# Patient Record
Sex: Female | Born: 1940 | Race: White | Hispanic: No | Smoking: Never smoker
Health system: Southern US, Community
[De-identification: ages and names within clinical notes are randomized; demographics above are authoritative.]

## PROBLEM LIST (undated history)

## (undated) DIAGNOSIS — F419 Anxiety disorder, unspecified: Secondary | ICD-10-CM

## (undated) DIAGNOSIS — M810 Age-related osteoporosis without current pathological fracture: Secondary | ICD-10-CM

## (undated) DIAGNOSIS — E079 Disorder of thyroid, unspecified: Secondary | ICD-10-CM

## (undated) DIAGNOSIS — I10 Essential (primary) hypertension: Secondary | ICD-10-CM

## (undated) DIAGNOSIS — F32A Depression, unspecified: Secondary | ICD-10-CM

## (undated) DIAGNOSIS — M1990 Unspecified osteoarthritis, unspecified site: Secondary | ICD-10-CM

## (undated) DIAGNOSIS — T7840XA Allergy, unspecified, initial encounter: Secondary | ICD-10-CM

## (undated) DIAGNOSIS — D649 Anemia, unspecified: Secondary | ICD-10-CM

## (undated) DIAGNOSIS — F329 Major depressive disorder, single episode, unspecified: Secondary | ICD-10-CM

## (undated) DIAGNOSIS — G709 Myoneural disorder, unspecified: Secondary | ICD-10-CM

## (undated) DIAGNOSIS — J45909 Unspecified asthma, uncomplicated: Secondary | ICD-10-CM

## (undated) DIAGNOSIS — H269 Unspecified cataract: Secondary | ICD-10-CM

## (undated) DIAGNOSIS — K219 Gastro-esophageal reflux disease without esophagitis: Secondary | ICD-10-CM

## (undated) DIAGNOSIS — E119 Type 2 diabetes mellitus without complications: Secondary | ICD-10-CM

## (undated) DIAGNOSIS — E785 Hyperlipidemia, unspecified: Secondary | ICD-10-CM

## (undated) HISTORY — PX: POLYPECTOMY: SHX149

## (undated) HISTORY — DX: Unspecified asthma, uncomplicated: J45.909

## (undated) HISTORY — DX: Disorder of thyroid, unspecified: E07.9

## (undated) HISTORY — DX: Hyperlipidemia, unspecified: E78.5

## (undated) HISTORY — DX: Anxiety disorder, unspecified: F41.9

## (undated) HISTORY — DX: Age-related osteoporosis without current pathological fracture: M81.0

## (undated) HISTORY — PX: COLONOSCOPY: SHX174

## (undated) HISTORY — DX: Depression, unspecified: F32.A

## (undated) HISTORY — PX: ABDOMINAL HYSTERECTOMY: SHX81

## (undated) HISTORY — DX: Unspecified cataract: H26.9

## (undated) HISTORY — DX: Myoneural disorder, unspecified: G70.9

## (undated) HISTORY — DX: Major depressive disorder, single episode, unspecified: F32.9

## (undated) HISTORY — PX: UPPER GASTROINTESTINAL ENDOSCOPY: SHX188

## (undated) HISTORY — DX: Allergy, unspecified, initial encounter: T78.40XA

## (undated) HISTORY — PX: CHOLECYSTECTOMY: SHX55

## (undated) HISTORY — DX: Unspecified osteoarthritis, unspecified site: M19.90

## (undated) HISTORY — DX: Gastro-esophageal reflux disease without esophagitis: K21.9

## (undated) HISTORY — DX: Essential (primary) hypertension: I10

## (undated) HISTORY — DX: Anemia, unspecified: D64.9

## (undated) HISTORY — DX: Type 2 diabetes mellitus without complications: E11.9

---

## 2006-12-12 ENCOUNTER — Ambulatory Visit (HOSPITAL_COMMUNITY): Admission: RE | Admit: 2006-12-12 | Discharge: 2006-12-12 | Payer: Self-pay | Admitting: Family Medicine

## 2007-12-18 ENCOUNTER — Ambulatory Visit (HOSPITAL_COMMUNITY): Admission: RE | Admit: 2007-12-18 | Discharge: 2007-12-18 | Payer: Self-pay | Admitting: Family Medicine

## 2009-03-15 ENCOUNTER — Ambulatory Visit (HOSPITAL_BASED_OUTPATIENT_CLINIC_OR_DEPARTMENT_OTHER): Admission: RE | Admit: 2009-03-15 | Discharge: 2009-03-15 | Payer: Self-pay | Admitting: Family Medicine

## 2009-03-15 ENCOUNTER — Ambulatory Visit: Payer: Self-pay | Admitting: Diagnostic Radiology

## 2010-05-22 ENCOUNTER — Ambulatory Visit (HOSPITAL_COMMUNITY): Admission: RE | Admit: 2010-05-22 | Discharge: 2010-05-22 | Payer: Self-pay | Admitting: Family Medicine

## 2010-10-21 ENCOUNTER — Encounter: Payer: Self-pay | Admitting: Family Medicine

## 2012-04-13 ENCOUNTER — Other Ambulatory Visit: Payer: Self-pay | Admitting: Family Medicine

## 2012-05-08 DIAGNOSIS — H2510 Age-related nuclear cataract, unspecified eye: Secondary | ICD-10-CM | POA: Diagnosis not present

## 2012-05-08 DIAGNOSIS — H04129 Dry eye syndrome of unspecified lacrimal gland: Secondary | ICD-10-CM | POA: Diagnosis not present

## 2012-05-08 DIAGNOSIS — H524 Presbyopia: Secondary | ICD-10-CM | POA: Diagnosis not present

## 2012-05-08 DIAGNOSIS — E119 Type 2 diabetes mellitus without complications: Secondary | ICD-10-CM | POA: Diagnosis not present

## 2012-05-15 ENCOUNTER — Ambulatory Visit (INDEPENDENT_AMBULATORY_CARE_PROVIDER_SITE_OTHER): Payer: Medicare Other | Admitting: Family Medicine

## 2012-05-15 VITALS — BP 116/73 | HR 75 | Temp 98.8°F | Resp 20 | Ht 64.5 in | Wt 261.0 lb

## 2012-05-15 DIAGNOSIS — K219 Gastro-esophageal reflux disease without esophagitis: Secondary | ICD-10-CM | POA: Insufficient documentation

## 2012-05-15 DIAGNOSIS — J309 Allergic rhinitis, unspecified: Secondary | ICD-10-CM | POA: Diagnosis not present

## 2012-05-15 DIAGNOSIS — R609 Edema, unspecified: Secondary | ICD-10-CM | POA: Insufficient documentation

## 2012-05-15 DIAGNOSIS — E7800 Pure hypercholesterolemia, unspecified: Secondary | ICD-10-CM

## 2012-05-15 DIAGNOSIS — M899 Disorder of bone, unspecified: Secondary | ICD-10-CM

## 2012-05-15 DIAGNOSIS — I10 Essential (primary) hypertension: Secondary | ICD-10-CM | POA: Insufficient documentation

## 2012-05-15 DIAGNOSIS — E119 Type 2 diabetes mellitus without complications: Secondary | ICD-10-CM | POA: Diagnosis not present

## 2012-05-15 DIAGNOSIS — E039 Hypothyroidism, unspecified: Secondary | ICD-10-CM

## 2012-05-15 DIAGNOSIS — F329 Major depressive disorder, single episode, unspecified: Secondary | ICD-10-CM | POA: Insufficient documentation

## 2012-05-15 DIAGNOSIS — E559 Vitamin D deficiency, unspecified: Secondary | ICD-10-CM | POA: Insufficient documentation

## 2012-05-15 DIAGNOSIS — M8580 Other specified disorders of bone density and structure, unspecified site: Secondary | ICD-10-CM | POA: Insufficient documentation

## 2012-05-15 LAB — POCT CBC
HCT, POC: 50.3 % — AB (ref 37.7–47.9)
Hemoglobin: 15.4 g/dL (ref 12.2–16.2)
Lymph, poc: 2.1 (ref 0.6–3.4)
MCHC: 30.6 g/dL — AB (ref 31.8–35.4)
MCV: 94.6 fL (ref 80–97)
POC LYMPH PERCENT: 31.8 %L (ref 10–50)
RDW, POC: 14.3 %

## 2012-05-15 MED ORDER — CITALOPRAM HYDROBROMIDE 40 MG PO TABS: 20.0000 mg | ORAL_TABLET | Freq: Every day | ORAL | Status: DC

## 2012-05-15 MED ORDER — FUROSEMIDE 40 MG PO TABS: 40.0000 mg | ORAL_TABLET | Freq: Two times a day (BID) | ORAL | Status: DC

## 2012-05-15 MED ORDER — ATENOLOL 25 MG PO TABS: 25.0000 mg | ORAL_TABLET | Freq: Every day | ORAL | Status: DC

## 2012-05-15 MED ORDER — LOVASTATIN 40 MG PO TABS: 40.0000 mg | ORAL_TABLET | Freq: Every day | ORAL | Status: DC

## 2012-05-15 MED ORDER — TRAZODONE HCL 100 MG PO TABS: 50.0000 mg | ORAL_TABLET | Freq: Every day | ORAL | Status: DC

## 2012-05-15 MED ORDER — POTASSIUM CHLORIDE ER 10 MEQ PO TBCR: 10.0000 meq | EXTENDED_RELEASE_TABLET | Freq: Every day | ORAL | Status: DC

## 2012-05-15 MED ORDER — METFORMIN HCL 500 MG PO TABS: 500.0000 mg | ORAL_TABLET | Freq: Every day | ORAL | Status: DC

## 2012-05-15 MED ORDER — LEVOTHYROXINE SODIUM 125 MCG PO TABS: 125.0000 ug | ORAL_TABLET | Freq: Every day | ORAL | Status: DC

## 2012-05-15 MED ORDER — QUINAPRIL HCL 20 MG PO TABS: 20.0000 mg | ORAL_TABLET | Freq: Every day | ORAL | Status: DC

## 2012-05-15 MED ORDER — MOMETASONE FUROATE 50 MCG/ACT NA SUSP: 2.0000 | Freq: Every day | NASAL | Status: DC

## 2012-05-15 NOTE — Progress Notes (Signed)
[facility] [address], [place] [address] [phone]  Date:  05/15/2012   Name:  Sandra Hughes   DOB:  March 15, 1941   MRN:  [id number]  PCP:  No primary provider on file.    Chief Complaint: Follow-up and Allergic Rhinitis    History of Present Illness:  Sandra Hughes is a 71 y.o. very pleasant female patient who presents with the following:  She is here today for follow-up and for labs.  She needs refills of her medication and labs. I have not seen her in about 2 years, and she wants to start seeing [place] regularly again.  However, it seems that she has medicaid in addition to medicare- this is probably why she has not been able to list our office as her PCP.  Explained to her that [facility] does not accept medicaid- however we do accept medicare and are happy to continue seeing her.    Maira states that all of her medical problems are unchanged, and that she is doing well and enjoying life currently.  She has no particular complaints.   She has not eaten for about 5 hours- not fasting for 8 hours however.  Patient Active Problem List  Diagnosis  . HTN (hypertension)  . Edema  . Allergic rhinitis  . Diabetes mellitus type II  . Osteopenia  . Depression  . Hypothyroidism  . GERD (gastroesophageal reflux disease)  . Hypercholesterolemia  . Vitamin d deficiency    No past medical history on file.  No past surgical history on file.  History  Substance Use Topics  . Smoking status: Never Smoker   . Smokeless tobacco: Not on file  . Alcohol Use: Not on file    No family history on file.  Allergies  Allergen Reactions  . Aspirin Anaphylaxis    Medication list has been reviewed and updated.  Current Outpatient Prescriptions on File Prior to Visit  Medication Sig Dispense Refill  . atenolol (TENORMIN) 25 MG tablet Take 25 mg by mouth daily.      . citalopram (CELEXA) 40 MG tablet Take 40 mg by mouth daily.      . furosemide (LASIX) 40 MG tablet  Take 1 tablet (40 mg total) by mouth 2 (two) times daily. Needs office visit  60 tablet  0  . levothyroxine (SYNTHROID, LEVOTHROID) 125 MCG tablet Take 1 tablet (125 mcg total) by mouth daily. Needs office visit  30 tablet  0  . lovastatin (MEVACOR) 40 MG tablet Take 1 tablet (40 mg total) by mouth daily. Needs office visit  30 tablet  0  . metFORMIN (GLUCOPHAGE) 500 MG tablet Take 1 tablet (500 mg total) by mouth daily. Needs office visit  30 tablet  0  . potassium chloride (KLOR-CON 10) 10 MEQ tablet Take 1 tablet (10 mEq total) by mouth daily. Needs office visit  30 tablet  0  . quinapril (ACCUPRIL) 20 MG tablet Take 1 tablet (20 mg total) by mouth daily. Needs office visit  30 tablet  0  . traZODone (DESYREL) 100 MG tablet Take 50 mg by mouth at bedtime.        Review of Systems:  As per HPI- otherwise negative.   Physical Examination: Filed Vitals:   05/15/12 1626  BP: 116/73  Pulse: 75  Temp: 98.8 F (37.1 C)  Resp: 20   Filed Vitals:   05/15/12 1626  Height: 5' 4.5" (1.638 m)  Weight: 261 lb (118.389 kg)  Body mass index is 44.11 kg/(m^2). Ideal Body Weight: Weight in (lb) to have BMI = 25: 147.6   GEN: WDWN, NAD, Non-toxic, A & O x 3, obese HEENT: Atraumatic, Normocephalic. Neck supple. No masses, No LAD.  PEERL, EOMI, TM and oropharynx wnl Ears and Nose: No external deformity. CV: RRR, No M/G/R. No JVD. No thrill. No extra heart sounds. PULM: CTA B, no wheezes, crackles, rhonchi. No retractions. No resp. distress. No accessory muscle use. ABD: S, NT, ND, +BS. No rebound. No HSM. EXTR: No c/c/e NEURO Normal gait.  PSYCH: Normally interactive. Conversant. Not depressed or anxious appearing.  Calm demeanor.   Results for orders placed in visit on 05/15/12  POCT CBC      Component Value Range   WBC 6.7  4.6 - 10.2 K/uL   Lymph, poc 2.1  0.6 - 3.4   POC LYMPH PERCENT 31.8  10 - 50 %L   MID (cbc) 0.6  0 - 0.9   POC MID % 9.1  0 - 12 %M   POC Granulocyte 4.0  2 -  6.9   Granulocyte percent 59.1  37 - 80 %G   RBC 5.32  4.04 - 5.48 M/uL   Hemoglobin 15.4  12.2 - 16.2 g/dL   HCT, POC 16.1 (*) 09.6 - 47.9 %   MCV 94.6  80 - 97 fL   MCH, POC 28.9  27 - 31.2 pg   MCHC 30.6 (*) 31.8 - 35.4 g/dL   RDW, POC 04.5     Platelet Count, POC 385  142 - 424 K/uL   MPV 9.2  0 - 99.8 fL  POCT GLYCOSYLATED HEMOGLOBIN (HGB A1C)      Component Value Range   Hemoglobin A1C 5.6     Assessment and Plan:  1. Diabetes mellitus type II  POCT glycosylated hemoglobin (Hb A1C), metFORMIN (GLUCOPHAGE) 500 MG tablet  2. HTN (hypertension)  atenolol (TENORMIN) 25 MG tablet, quinapril (ACCUPRIL) 20 MG tablet, POCT CBC, Comprehensive metabolic panel  3. Edema  furosemide (LASIX) 40 MG tablet, potassium chloride (KLOR-CON 10) 10 MEQ tablet  4. Allergic rhinitis  mometasone (NASONEX) 50 MCG/ACT nasal spray  5. Osteopenia    6. Depression  citalopram (CELEXA) 40 MG tablet, traZODone (DESYREL) 100 MG tablet  7. Hypothyroidism  levothyroxine (SYNTHROID, LEVOTHROID) 125 MCG tablet, TSH  8. GERD (gastroesophageal reflux disease)    9. Hypercholesterolemia  lovastatin (MEVACOR) 40 MG tablet, Lipid panel  10. Vitamin d deficiency     Excellent DM control, BP looks great, her edema is controlled.  Refilled all of her medications as well as her test strips.  Will plan further follow- up pending labs.  Orders Today:  Orders Placed This Encounter  Procedures  . Comprehensive metabolic panel  . Lipid panel  . TSH  . POCT CBC  . POCT glycosylated hemoglobin (Hb A1C)    Medications Today: (Includes new updates added during medication reconciliation) Meds ordered this encounter  Medications  . DISCONTD: traZODone (DESYREL) 100 MG tablet    Sig: Take 50 mg by mouth at bedtime.  Marland Kitchen DISCONTD: citalopram (CELEXA) 40 MG tablet    Sig: Take 40 mg by mouth daily.  Marland Kitchen DISCONTD: atenolol (TENORMIN) 25 MG tablet    Sig: Take 25 mg by mouth daily.  . cycloSPORINE (RESTASIS) 0.05 % ophthalmic  emulsion    Sig: Place 1 drop into both eyes 2 (two) times daily.  Marland Kitchen atenolol (TENORMIN) 25 MG tablet    Sig: Take  1 tablet (25 mg total) by mouth daily.    Dispense:  90 tablet    Refill:  3  . citalopram (CELEXA) 40 MG tablet    Sig: Take 0.5 tablets (20 mg total) by mouth daily.    Dispense:  45 tablet    Refill:  3  . furosemide (LASIX) 40 MG tablet    Sig: Take 1 tablet (40 mg total) by mouth 2 (two) times daily. Needs office visit    Dispense:  180 tablet    Refill:  3  . levothyroxine (SYNTHROID, LEVOTHROID) 125 MCG tablet    Sig: Take 1 tablet (125 mcg total) by mouth daily. Needs office visit    Dispense:  90 tablet    Refill:  3  . lovastatin (MEVACOR) 40 MG tablet    Sig: Take 1 tablet (40 mg total) by mouth daily. Needs office visit    Dispense:  30 tablet    Refill:  0  . potassium chloride (KLOR-CON 10) 10 MEQ tablet    Sig: Take 1 tablet (10 mEq total) by mouth daily. Needs office visit    Dispense:  90 tablet    Refill:  3  . quinapril (ACCUPRIL) 20 MG tablet    Sig: Take 1 tablet (20 mg total) by mouth daily. Needs office visit    Dispense:  90 tablet    Refill:  3  . traZODone (DESYREL) 100 MG tablet    Sig: Take 0.5 tablets (50 mg total) by mouth at bedtime.    Dispense:  45 tablet    Refill:  3  . mometasone (NASONEX) 50 MCG/ACT nasal spray    Sig: Place 2 sprays into the nose daily.    Dispense:  17 g    Refill:  12  . metFORMIN (GLUCOPHAGE) 500 MG tablet    Sig: Take 1 tablet (500 mg total) by mouth daily. Needs office visit    Dispense:  90 tablet    Refill:  3    Medications Discontinued: Medications Discontinued During This Encounter  Medication Reason  . atenolol (TENORMIN) 25 MG tablet Reorder  . citalopram (CELEXA) 40 MG tablet Reorder  . furosemide (LASIX) 40 MG tablet Reorder  . levothyroxine (SYNTHROID, LEVOTHROID) 125 MCG tablet Reorder  . lovastatin (MEVACOR) 40 MG tablet Reorder  . potassium chloride (KLOR-CON 10) 10 MEQ tablet  Reorder  . quinapril (ACCUPRIL) 20 MG tablet Reorder  . traZODone (DESYREL) 100 MG tablet Reorder  . metFORMIN (GLUCOPHAGE) 500 MG tablet Reorder     Abbe Amsterdam, MD

## 2012-05-16 ENCOUNTER — Other Ambulatory Visit: Payer: Self-pay | Admitting: Family Medicine

## 2012-05-16 LAB — COMPREHENSIVE METABOLIC PANEL
Albumin: 4.3 g/dL (ref 3.5–5.2)
Alkaline Phosphatase: 80 U/L (ref 39–117)
BUN: 17 mg/dL (ref 6–23)
CO2: 25 mEq/L (ref 19–32)
Glucose, Bld: 99 mg/dL (ref 70–99)
Potassium: 4.4 mEq/L (ref 3.5–5.3)
Total Bilirubin: 0.3 mg/dL (ref 0.3–1.2)
Total Protein: 7.4 g/dL (ref 6.0–8.3)

## 2012-05-16 LAB — LIPID PANEL
Cholesterol: 206 mg/dL — ABNORMAL HIGH (ref 0–200)
HDL: 58 mg/dL (ref >39)
LDL Cholesterol: 109 mg/dL — ABNORMAL HIGH (ref 0–99)
Triglycerides: 195 mg/dL — ABNORMAL HIGH (ref <150)

## 2012-05-17 ENCOUNTER — Encounter: Payer: Self-pay | Admitting: Family Medicine

## 2012-05-18 ENCOUNTER — Telehealth: Payer: Self-pay | Admitting: Radiology

## 2012-05-18 DIAGNOSIS — E7800 Pure hypercholesterolemia, unspecified: Secondary | ICD-10-CM

## 2012-05-18 MED ORDER — LOVASTATIN 40 MG PO TABS: 40.0000 mg | ORAL_TABLET | Freq: Every day | ORAL | Status: DC

## 2012-05-22 DIAGNOSIS — E119 Type 2 diabetes mellitus without complications: Secondary | ICD-10-CM | POA: Diagnosis not present

## 2012-05-22 NOTE — Telephone Encounter (Signed)
Medication encounter

## 2012-08-23 ENCOUNTER — Ambulatory Visit (INDEPENDENT_AMBULATORY_CARE_PROVIDER_SITE_OTHER): Payer: Medicare Other | Admitting: Family Medicine

## 2012-08-23 VITALS — BP 148/86 | HR 90 | Temp 98.0°F | Resp 18 | Ht 64.5 in | Wt 259.0 lb

## 2012-08-23 DIAGNOSIS — Z23 Encounter for immunization: Secondary | ICD-10-CM | POA: Diagnosis not present

## 2012-08-23 DIAGNOSIS — E039 Hypothyroidism, unspecified: Secondary | ICD-10-CM

## 2012-08-23 DIAGNOSIS — I10 Essential (primary) hypertension: Secondary | ICD-10-CM

## 2012-08-23 LAB — TSH: TSH: 1.298 u[IU]/mL (ref 0.350–4.500)

## 2012-08-23 LAB — POCT CBC
Hemoglobin: 15 g/dL (ref 12.2–16.2)
Lymph, poc: 2.2 (ref 0.6–3.4)
MCH, POC: 29.4 pg (ref 27–31.2)
MCHC: 31.3 g/dL — AB (ref 31.8–35.4)
MID (cbc): 0.5 (ref 0–0.9)
MPV: 8.9 fL (ref 0–99.8)
POC Granulocyte: 3.8 (ref 2–6.9)
POC MID %: 7.3 %M (ref 0–12)
Platelet Count, POC: 383 10*3/uL (ref 142–424)
RDW, POC: 14.7 %
WBC: 6.5 10*3/uL (ref 4.6–10.2)

## 2012-08-23 LAB — BASIC METABOLIC PANEL
Calcium: 8.9 mg/dL (ref 8.4–10.5)
Creat: 0.86 mg/dL (ref 0.50–1.10)
Sodium: 139 mEq/L (ref 135–145)

## 2012-08-23 NOTE — Progress Notes (Signed)
[facility] [address] [place] [address] [phone]  Date:  08/23/2012   Name:  Sandra Hughes   DOB:  Jul 07, 1941   MRN:  [id number]  PCP:  Tally Due, MD    Chief Complaint: Hypertension and Labs Only   History of Present Illness:  Sandra Hughes is a 71 y.o. very pleasant female patient who presents with the following:  She is here today with concern regarding her BP.  Over the last month or so her BP has been running higher.  In August her BP was 116/73.  Her BP has been running about 145- 155/ 108-119.  However, her personal cuff was reading higher than our cuff here today so she is afraid it may not be totally accurate.  She usually checks her BP at night.  She feels that she gets "cold inside" when her BP is too high or to low, so this is her cue to check her BP.    She also has well controlled DM, edema, AR and hypothyroidism.   She has not noted any CP Patient Active Problem List  Diagnosis  . HTN (hypertension)  . Edema  . Allergic rhinitis  . Diabetes mellitus type II  . Osteopenia  . Depression  . Hypothyroidism  . GERD (gastroesophageal reflux disease)  . Hypercholesterolemia  . Vitamin d deficiency    Past Medical History  Diagnosis Date  . Allergy   . Anemia   . Anxiety   . Arthritis   . Depression   . GERD (gastroesophageal reflux disease)   . Diabetes mellitus without complication   . Osteoporosis   . Hypertension   . Thyroid disease     Past Surgical History  Procedure Date  . Cholecystectomy   . Abdominal hysterectomy     no cancer    History  Substance Use Topics  . Smoking status: Never Smoker   . Smokeless tobacco: Never Used  . Alcohol Use: No    Family History  Problem Relation Age of Onset  . Hypertension Mother   . COPD Father   . Arthritis Sister   . Diabetes Brother   . Cancer Maternal Grandfather     skin cancer  . Heart disease Paternal Grandfather   . Diabetes Sister      Allergies  Allergen Reactions  . Aspirin Anaphylaxis    Medication list has been reviewed and updated.  Current Outpatient Prescriptions on File Prior to Visit  Medication Sig Dispense Refill  . atenolol (TENORMIN) 25 MG tablet TAKE 1 TABLET EVERY DAY  90 tablet  3  . citalopram (CELEXA) 40 MG tablet TAKE 1 TABLET EVERY DAY  90 tablet  2  . cycloSPORINE (RESTASIS) 0.05 % ophthalmic emulsion Place 1 drop into both eyes 2 (two) times daily.      . furosemide (LASIX) 40 MG tablet Take 1 tablet (40 mg total) by mouth 2 (two) times daily. Needs office visit  180 tablet  3  . levothyroxine (SYNTHROID, LEVOTHROID) 125 MCG tablet Take 1 tablet (125 mcg total) by mouth daily. Needs office visit  90 tablet  3  . lovastatin (MEVACOR) 40 MG tablet Take 1 tablet (40 mg total) by mouth daily. Needs office visit  90 tablet  2  . metFORMIN (GLUCOPHAGE) 500 MG tablet Take 1 tablet (500 mg total) by mouth daily. Needs office visit  90 tablet  3  . mometasone (NASONEX) 50 MCG/ACT nasal spray Place 2 sprays  into the nose daily.  17 g  12  . potassium chloride (KLOR-CON 10) 10 MEQ tablet Take 1 tablet (10 mEq total) by mouth daily. Needs office visit  90 tablet  3  . quinapril (ACCUPRIL) 20 MG tablet Take 1 tablet (20 mg total) by mouth daily. Needs office visit  90 tablet  3  . traZODone (DESYREL) 100 MG tablet Take 0.5 tablets (50 mg total) by mouth at bedtime.  45 tablet  3    Review of Systems:  As per HPI- otherwise negative.  Physical Examination: Filed Vitals:   08/23/12 1044  BP: 148/86  Pulse: 90  Temp: 98 F (36.7 C)  Resp: 18   Filed Vitals:   08/23/12 1044  Height: 5' 4.5" (1.638 m)  Weight: 259 lb (117.482 kg)   Body mass index is 43.77 kg/(m^2). Ideal Body Weight: Weight in (lb) to have BMI = 25: 147.6   GEN: WDWN, NAD, Non-toxic, A & O x 3, obese HEENT: Atraumatic, Normocephalic. Neck supple. No masses, No LAD. Bilateral TM wnl, oropharynx normal.  PEERL,EOMI.   Ears and  Nose: No external deformity. CV: RRR, No M/G/R. No JVD. No thrill. No extra heart sounds. PULM: CTA B, no wheezes, crackles, rhonchi. No retractions. No resp. distress. No accessory muscle use. ABD: S, NT, ND EXTR: No c/c/e NEURO Normal gait.  PSYCH: Normally interactive. Conversant. Not depressed or anxious appearing.  Calm demeanor.   Results for orders placed in visit on 08/23/12  POCT CBC      Component Value Range   WBC 6.5  4.6 - 10.2 K/uL   Lymph, poc 2.2  0.6 - 3.4   POC LYMPH PERCENT 33.6  10 - 50 %L   MID (cbc) 0.5  0 - 0.9   POC MID % 7.3  0 - 12 %M   POC Granulocyte 3.8  2 - 6.9   Granulocyte percent 59.1  37 - 80 %G   RBC 5.11  4.04 - 5.48 M/uL   Hemoglobin 15.0  12.2 - 16.2 g/dL   HCT, POC 45.4 (*) 09.8 - 47.9 %   MCV 94.0  80 - 97 fL   MCH, POC 29.4  27 - 31.2 pg   MCHC 31.3 (*) 31.8 - 35.4 g/dL   RDW, POC 11.9     Platelet Count, POC 383  142 - 424 K/uL   MPV 8.9  0 - 99.8 fL   Await other labs  EKG: sinus rhythm with RBBB- this is stable from last year. Carefully compared to EKG from 04/20/11 and there is no significant change  Assessment and Plan: 1. HTN (hypertension)  POCT CBC, Basic metabolic panel, TSH, EKG 12-Lead, Flu vaccine greater than or equal to 3yo preservative free IM  2. Hypothyroidism  TSH  mildly elevated BP.  Will increase her quinapril slowly- see pt instructions.   Check TSH.   Will plan further follow- up pending labs. Encouraged her to find a weight loss plan.  She really wants to do this but lacks transportation and feels somewhat unmotivated to make what she knows are necessary changes.    Abbe Amsterdam, MD

## 2012-08-23 NOTE — Patient Instructions (Addendum)
Please add a 1/2 accupril in the evening.  So you will take 20 mg in the am, 10 in the pm.  Please give me a call and let me know how your BP is looking in the next couple of weeks.    Think about joining weight watchers-you can do this online.

## 2012-08-24 ENCOUNTER — Telehealth: Payer: Self-pay | Admitting: *Deleted

## 2012-08-24 ENCOUNTER — Encounter: Payer: Self-pay | Admitting: Family Medicine

## 2012-08-31 ENCOUNTER — Telehealth: Payer: Self-pay

## 2012-08-31 NOTE — Telephone Encounter (Signed)
Please give her a call- that sounds great, let's see her back in about 3 months

## 2012-08-31 NOTE — Telephone Encounter (Signed)
Pt was told to call in regards to blood pressure - with increase in bp meds it is 130-75 with pulse of 61   Best number [phone]

## 2012-08-31 NOTE — Telephone Encounter (Signed)
Patient called and was told that BP looked great and to follow up in about 3 months. Patient stated she will keep doing what she is doing.

## 2012-10-12 ENCOUNTER — Telehealth: Payer: Self-pay

## 2012-10-12 DIAGNOSIS — I10 Essential (primary) hypertension: Secondary | ICD-10-CM

## 2012-10-12 MED ORDER — QUINAPRIL HCL 20 MG PO TABS: ORAL_TABLET | ORAL | Status: DC

## 2012-10-12 NOTE — Telephone Encounter (Signed)
Called her, she is taking  1 pill am and 1/2 pill at night. Her blood pressures have been about 140/78. She states this is good for her.  She states she uses [facility] and we need to make sure they know we increased the dose and this is the reason she needs to get it early. Please advise on dosing.

## 2012-10-12 NOTE — Telephone Encounter (Signed)
PT WOULD LIKE TO KNOW IF THE DR WANT TO UP HER DOSAGE ON HER QUINAPRIL OR KEEP IT THE SAME, SHE IS ABOUT OUT PLEASE CALL [phone] OR HER CELL AT [phone]

## 2012-10-12 NOTE — Telephone Encounter (Signed)
Called and LMOM- that sounds fine.  I will send in a refill for her and will increase the amount that she gets each month.  Will send in a 90 day supply. Her new dosage will be quinapril 20, 1 q am and 1/2 q pm

## 2013-02-07 ENCOUNTER — Other Ambulatory Visit: Payer: Self-pay | Admitting: Family Medicine

## 2013-05-08 ENCOUNTER — Other Ambulatory Visit: Payer: Self-pay | Admitting: Family Medicine

## 2013-05-11 ENCOUNTER — Other Ambulatory Visit: Payer: Self-pay | Admitting: Family Medicine

## 2013-05-12 ENCOUNTER — Other Ambulatory Visit: Payer: Self-pay | Admitting: Physician Assistant

## 2013-05-12 NOTE — Telephone Encounter (Signed)
Called patient she needs appt.

## 2013-05-12 NOTE — Telephone Encounter (Signed)
Patient advised she needs office visit.

## 2013-05-26 ENCOUNTER — Ambulatory Visit (INDEPENDENT_AMBULATORY_CARE_PROVIDER_SITE_OTHER): Payer: Medicare Other | Admitting: Family Medicine

## 2013-05-26 VITALS — BP 118/66 | HR 62 | Temp 99.0°F | Resp 18 | Ht 64.5 in | Wt 253.8 lb

## 2013-05-26 DIAGNOSIS — I10 Essential (primary) hypertension: Secondary | ICD-10-CM | POA: Diagnosis not present

## 2013-05-26 DIAGNOSIS — F329 Major depressive disorder, single episode, unspecified: Secondary | ICD-10-CM

## 2013-05-26 DIAGNOSIS — E7800 Pure hypercholesterolemia, unspecified: Secondary | ICD-10-CM

## 2013-05-26 DIAGNOSIS — E119 Type 2 diabetes mellitus without complications: Secondary | ICD-10-CM | POA: Diagnosis not present

## 2013-05-26 DIAGNOSIS — Z23 Encounter for immunization: Secondary | ICD-10-CM

## 2013-05-26 DIAGNOSIS — E669 Obesity, unspecified: Secondary | ICD-10-CM

## 2013-05-26 DIAGNOSIS — E039 Hypothyroidism, unspecified: Secondary | ICD-10-CM

## 2013-05-26 DIAGNOSIS — R609 Edema, unspecified: Secondary | ICD-10-CM

## 2013-05-26 DIAGNOSIS — H04129 Dry eye syndrome of unspecified lacrimal gland: Secondary | ICD-10-CM

## 2013-05-26 DIAGNOSIS — J309 Allergic rhinitis, unspecified: Secondary | ICD-10-CM

## 2013-05-26 LAB — POCT CBC
HCT, POC: 44.3 % (ref 37.7–47.9)
Hemoglobin: 14.3 g/dL (ref 12.2–16.2)
Lymph, poc: 2 (ref 0.6–3.4)
MCH, POC: 30.6 pg (ref 27–31.2)
MCHC: 32.3 g/dL (ref 31.8–35.4)
MCV: 94.7 fL (ref 80–97)
POC Granulocyte: 4.7 (ref 2–6.9)
POC LYMPH PERCENT: 27.3 %L (ref 10–50)
RDW, POC: 15.8 %
WBC: 7.2 10*3/uL (ref 4.6–10.2)

## 2013-05-26 LAB — LIPID PANEL
LDL Cholesterol: 111 mg/dL — ABNORMAL HIGH (ref 0–99)
Total CHOL/HDL Ratio: 3.1 Ratio
VLDL: 34 mg/dL (ref 0–40)

## 2013-05-26 LAB — COMPREHENSIVE METABOLIC PANEL
Albumin: 4.3 g/dL (ref 3.5–5.2)
BUN: 26 mg/dL — ABNORMAL HIGH (ref 6–23)
CO2: 27 mEq/L (ref 19–32)
Glucose, Bld: 138 mg/dL — ABNORMAL HIGH (ref 70–99)
Sodium: 140 mEq/L (ref 135–145)
Total Bilirubin: 0.4 mg/dL (ref 0.3–1.2)
Total Protein: 7.8 g/dL (ref 6.0–8.3)

## 2013-05-26 LAB — TSH: TSH: 0.959 u[IU]/mL (ref 0.350–4.500)

## 2013-05-26 MED ORDER — LEVOTHYROXINE SODIUM 125 MCG PO TABS: ORAL_TABLET | ORAL | Status: DC

## 2013-05-26 MED ORDER — POTASSIUM CHLORIDE ER 10 MEQ PO TBCR: 10.0000 meq | EXTENDED_RELEASE_TABLET | Freq: Every day | ORAL | Status: DC

## 2013-05-26 MED ORDER — QUINAPRIL HCL 20 MG PO TABS: ORAL_TABLET | ORAL | Status: DC

## 2013-05-26 MED ORDER — CYCLOSPORINE 0.05 % OP EMUL: 1.0000 [drp] | Freq: Two times a day (BID) | OPHTHALMIC | Status: DC

## 2013-05-26 MED ORDER — ATENOLOL 25 MG PO TABS: ORAL_TABLET | ORAL | Status: DC

## 2013-05-26 MED ORDER — LOVASTATIN 40 MG PO TABS: ORAL_TABLET | ORAL | Status: DC

## 2013-05-26 MED ORDER — TRAZODONE HCL 100 MG PO TABS: 50.0000 mg | ORAL_TABLET | Freq: Every day | ORAL | Status: DC

## 2013-05-26 MED ORDER — METFORMIN HCL 500 MG PO TABS: ORAL_TABLET | ORAL | Status: DC

## 2013-05-26 MED ORDER — CITALOPRAM HYDROBROMIDE 40 MG PO TABS: ORAL_TABLET | ORAL | Status: DC

## 2013-05-26 MED ORDER — MOMETASONE FUROATE 50 MCG/ACT NA SUSP: 2.0000 | Freq: Every day | NASAL | Status: DC

## 2013-05-26 MED ORDER — FUROSEMIDE 40 MG PO TABS: ORAL_TABLET | ORAL | Status: DC

## 2013-05-26 NOTE — Patient Instructions (Addendum)
Please keep in touch and let me know how things are going.  Work on taking better care of yourself with a good sleep, diet and exercise routine.  Plan times with friends and neighbors, and spend time outside.   I will be in touch with the rest of your labs  I will let you know how your TSH looks and make sure we do not need to change your thyroid dose

## 2013-05-26 NOTE — Progress Notes (Signed)
[facility] [address], [place] [address] [phone]  Date:  05/26/2013   Name:  Sandra Hughes   DOB:  [birth year]   MRN:  [id number]  PCP:  Sandra Due, MD    Chief Complaint: Medication Refill   History of Present Illness:  Sandra Hughes is a 72 y.o. very pleasant female patient who presents with the following:  Here today for medication refill.  She was last here in November of 2013.  She continues to have concerns about depression, inability to lose weight, and lack of access to exercise.  She feels "dragging."  She treid to stop her celexa- she has been off for a month because she ran out but notes that she cries easily and she would like to get back on this.   Not checking her home BP, but good reading today.   She is fasting this morning.   Would like to go ahead and get her flu shot today.    Patient Active Problem List   Diagnosis Date Noted  . HTN (hypertension) 05/15/2012  . Edema 05/15/2012  . Allergic rhinitis 05/15/2012  . Diabetes mellitus type II 05/15/2012  . Osteopenia 05/15/2012  . Depression 05/15/2012  . Hypothyroidism 05/15/2012  . GERD (gastroesophageal reflux disease) 05/15/2012  . Hypercholesterolemia 05/15/2012  . Vitamin d deficiency 05/15/2012    Past Medical History  Diagnosis Date  . Allergy   . Anemia   . Anxiety   . Arthritis   . Depression   . GERD (gastroesophageal reflux disease)   . Diabetes mellitus without complication   . Osteoporosis   . Hypertension   . Thyroid disease   . Cataract     Past Surgical History  Procedure Laterality Date  . Cholecystectomy    . Abdominal hysterectomy      no cancer    History  Substance Use Topics  . Smoking status: Never Smoker   . Smokeless tobacco: Never Used  . Alcohol Use: No    Family History  Problem Relation Age of Onset  . Hypertension Mother   . COPD Father   . Arthritis Sister   . Diabetes Brother   . Cancer Maternal  Grandfather     skin cancer  . Heart disease Paternal Grandfather   . Diabetes Sister     Allergies  Allergen Reactions  . Aspirin Anaphylaxis    Medication list has been reviewed and updated.  Current Outpatient Prescriptions on File Prior to Visit  Medication Sig Dispense Refill  . atenolol (TENORMIN) 25 MG tablet TAKE 1 TABLET EVERY DAY  90 tablet  3  . citalopram (CELEXA) 40 MG tablet TAKE 1 TABLET EVERY DAY  90 tablet  2  . cycloSPORINE (RESTASIS) 0.05 % ophthalmic emulsion Place 1 drop into both eyes 2 (two) times daily.      . furosemide (LASIX) 40 MG tablet TAKE 1 TABLET TWICE A DAY (NEED APPT)  30 tablet  0  . levothyroxine (SYNTHROID, LEVOTHROID) 125 MCG tablet TAKE 1 TABLET DAILY BEFORE BREAKFAST (NEED APPT)  15 tablet  0  . lovastatin (MEVACOR) 40 MG tablet TAKE 1 TABLET BY MOUTH DAILY  30 tablet  0  . metFORMIN (GLUCOPHAGE) 500 MG tablet TAKE 1 TABLET DAILY WITH BREAKFAST (NEED APPT)  15 tablet  0  . potassium chloride (KLOR-CON 10) 10 MEQ tablet Take 1 tablet (10 mEq total) by mouth daily. PATIENT NEEDS OFFICE VISIT FOR ADDITIONAL REFILLS  30 tablet  0  . quinapril (ACCUPRIL) 20 MG tablet Take one pill in the morning and a half pill at night.  135 tablet  3  . traZODone (DESYREL) 100 MG tablet Take 0.5 tablets (50 mg total) by mouth at bedtime.  45 tablet  3  . mometasone (NASONEX) 50 MCG/ACT nasal spray Place 2 sprays into the nose daily.  17 g  12   No current facility-administered medications on file prior to visit.    Review of Systems:  As per HPI- otherwise negative.   Physical Examination: Filed Vitals:   05/26/13 0937  BP: 118/66  Pulse: 62  Temp: 99 F (37.2 C)  Resp: 18   Filed Vitals:   05/26/13 0937  Height: 5' 4.5" (1.638 m)  Weight: 253 lb 12.8 oz (115.123 kg)   Body mass index is 42.91 kg/(m^2). Ideal Body Weight: Weight in (lb) to have BMI = 25: 147.6  GEN: WDWN, NAD, Non-toxic, A & O x 3, obese HEENT: Atraumatic, Normocephalic. Neck  supple. No masses, No LAD.  Bilateral TM wnl, oropharynx normal.  PEERL,EOMI.   Ears and Nose: No external deformity. CV: RRR, No M/G/R. No JVD. No thrill. No extra heart sounds. PULM: CTA B, no wheezes, crackles, rhonchi. No retractions. No resp. distress. No accessory muscle use. ABD: S, NT, ND, +BS. No rebound. No HSM. EXTR: No c/c/e NEURO Normal gait.  PSYCH: Normally interactive. Conversant. Not depressed or anxious appearing.  Calm demeanor.   Results for orders placed in visit on 05/26/13  POCT CBC      Result Value Range   WBC 7.2  4.6 - 10.2 K/uL   Lymph, poc 2.0  0.6 - 3.4   POC LYMPH PERCENT 27.3  10 - 50 %L   MID (cbc) 0.5  0 - 0.9   POC MID % 7.1  0 - 12 %M   POC Granulocyte 4.7  2 - 6.9   Granulocyte percent 65.6  37 - 80 %G   RBC 4.68  4.04 - 5.48 M/uL   Hemoglobin 14.3  12.2 - 16.2 g/dL   HCT, POC 16.1  09.6 - 47.9 %   MCV 94.7  80 - 97 fL   MCH, POC 30.6  27 - 31.2 pg   MCHC 32.3  31.8 - 35.4 g/dL   RDW, POC 04.5     Platelet Count, POC 322  142 - 424 K/uL   MPV 9.9  0 - 99.8 fL  POCT GLYCOSYLATED HEMOGLOBIN (HGB A1C)      Result Value Range   Hemoglobin A1C 5.6       Assessment and Plan: Type II or unspecified type diabetes mellitus without mention of complication, not stated as uncontrolled - Plan: POCT glycosylated hemoglobin (Hb A1C), Microalbumin, urine, metFORMIN (GLUCOPHAGE) 500 MG tablet  Flu vaccine need - Plan: Flu Vaccine QUAD 36+ mos IM  Unspecified hypothyroidism - Plan: TSH, levothyroxine (SYNTHROID, LEVOTHROID) 125 MCG tablet  Depression - Plan: citalopram (CELEXA) 40 MG tablet, traZODone (DESYREL) 100 MG tablet  Obesity, unspecified - Plan: Lipid panel  Essential hypertension, benign - Plan: POCT CBC, Comprehensive metabolic panel  HTN (hypertension) - Plan: atenolol (TENORMIN) 25 MG tablet, quinapril (ACCUPRIL) 20 MG tablet  Edema - Plan: furosemide (LASIX) 40 MG tablet, potassium chloride (KLOR-CON 10) 10 MEQ tablet  Dry eyes,  unspecified laterality - Plan: cycloSPORINE (RESTASIS) 0.05 % ophthalmic emulsion  High cholesterol - Plan: lovastatin (MEVACOR) 40 MG tablet  Allergic rhinitis - Plan: mometasone (NASONEX) 50 MCG/ACT nasal  spray  Sandra Hughes DM is well controlled.  Again discussed diet and exercise strategies.  She suffers some from social isolation and lack of transportation.  She [place] in a safe neighborhood with many close neighbors.  Encouraged her to spend time with those around her and to try to find opportunities for group exercise such as walking. Discussed possible concerns with the combination of celexa and trazadone.  Need to monitor her electrolytes.  At this time she does not feel she can stop her trazadone, and wants to get back on celexa in hopes of improving her depression sx.  Denies any SI ideation.   BP controlled.  Will plan further follow- up pending labs.    Signed Abbe Amsterdam, MD

## 2013-05-27 ENCOUNTER — Encounter: Payer: Self-pay | Admitting: Family Medicine

## 2013-09-03 ENCOUNTER — Other Ambulatory Visit: Payer: Self-pay | Admitting: Family Medicine

## 2014-02-10 ENCOUNTER — Ambulatory Visit (INDEPENDENT_AMBULATORY_CARE_PROVIDER_SITE_OTHER): Payer: Medicare Other | Admitting: Family Medicine

## 2014-02-10 VITALS — BP 124/73 | HR 64 | Temp 98.5°F | Resp 16 | Ht 64.0 in | Wt 241.0 lb

## 2014-02-10 DIAGNOSIS — E7800 Pure hypercholesterolemia, unspecified: Secondary | ICD-10-CM

## 2014-02-10 DIAGNOSIS — I10 Essential (primary) hypertension: Secondary | ICD-10-CM | POA: Diagnosis not present

## 2014-02-10 DIAGNOSIS — Z23 Encounter for immunization: Secondary | ICD-10-CM | POA: Diagnosis not present

## 2014-02-10 DIAGNOSIS — E119 Type 2 diabetes mellitus without complications: Secondary | ICD-10-CM

## 2014-02-10 DIAGNOSIS — F329 Major depressive disorder, single episode, unspecified: Secondary | ICD-10-CM | POA: Diagnosis not present

## 2014-02-10 DIAGNOSIS — R1310 Dysphagia, unspecified: Secondary | ICD-10-CM

## 2014-02-10 DIAGNOSIS — E039 Hypothyroidism, unspecified: Secondary | ICD-10-CM

## 2014-02-10 DIAGNOSIS — R609 Edema, unspecified: Secondary | ICD-10-CM

## 2014-02-10 DIAGNOSIS — F3289 Other specified depressive episodes: Secondary | ICD-10-CM | POA: Diagnosis not present

## 2014-02-10 DIAGNOSIS — J309 Allergic rhinitis, unspecified: Secondary | ICD-10-CM | POA: Diagnosis not present

## 2014-02-10 DIAGNOSIS — F32A Depression, unspecified: Secondary | ICD-10-CM

## 2014-02-10 LAB — CBC
HEMATOCRIT: 45.1 % (ref 36.0–46.0)
HEMOGLOBIN: 15.2 g/dL — AB (ref 12.0–15.0)
MCH: 29.6 pg (ref 26.0–34.0)
MCHC: 33.7 g/dL (ref 30.0–36.0)
MCV: 87.9 fL (ref 78.0–100.0)
Platelets: 336 10*3/uL (ref 150–400)
RBC: 5.13 MIL/uL — ABNORMAL HIGH (ref 3.87–5.11)
RDW: 14 % (ref 11.5–15.5)
WBC: 6.8 10*3/uL (ref 4.0–10.5)

## 2014-02-10 LAB — POCT GLYCOSYLATED HEMOGLOBIN (HGB A1C): Hemoglobin A1C: 5.6

## 2014-02-10 MED ORDER — ATENOLOL 25 MG PO TABS: ORAL_TABLET | ORAL | Status: DC

## 2014-02-10 MED ORDER — LEVOTHYROXINE SODIUM 125 MCG PO TABS: ORAL_TABLET | ORAL | Status: DC

## 2014-02-10 MED ORDER — POTASSIUM CHLORIDE ER 10 MEQ PO TBCR: 10.0000 meq | EXTENDED_RELEASE_TABLET | Freq: Every day | ORAL | Status: DC

## 2014-02-10 MED ORDER — FUROSEMIDE 40 MG PO TABS: ORAL_TABLET | ORAL | Status: DC

## 2014-02-10 MED ORDER — LOVASTATIN 40 MG PO TABS: ORAL_TABLET | ORAL | Status: DC

## 2014-02-10 MED ORDER — TRAZODONE HCL 100 MG PO TABS: 50.0000 mg | ORAL_TABLET | Freq: Every day | ORAL | Status: DC

## 2014-02-10 MED ORDER — METFORMIN HCL 500 MG PO TABS: ORAL_TABLET | ORAL | Status: DC

## 2014-02-10 MED ORDER — QUINAPRIL HCL 20 MG PO TABS: ORAL_TABLET | ORAL | Status: DC

## 2014-02-10 MED ORDER — CITALOPRAM HYDROBROMIDE 20 MG PO TABS: ORAL_TABLET | ORAL | Status: DC

## 2014-02-10 NOTE — Patient Instructions (Signed)
I will be in touch with the rest of your labs I will get you referred to GI; you need a colonoscopy and evaluation of your swallowing difficulty.  In the meantime be sure to drink liquids when you eat and chew smaller bites carefully Your diabetes and BP look great today.    You can get the shingles vaccine (zostavaz) at the drug store at your convenience.  Wait at least a month after your pneumonia shot however.    Please do look into the [employer] bus transportation to help you get to needed appointments. Make your mammogram and eye exam appointments asap!

## 2014-02-10 NOTE — Progress Notes (Addendum)
[facility] [address] [place] [id number] [phone]  Date:  02/10/2014   Name:  Sandra Hughes   DOB:  [birth year]   MRN:  [id number]  PCP:  Kennon Portela, MD    Chief Complaint: lab work   History of Present Illness:  Sandra Hughes is a 73 y.o. very pleasant female patient who presents with the following:  She is here today to follow-up on her chronic health problems.  She is suffering some from her allergies right now . She is not taking any medications for this at this time, she does use a neti- pot which helps.  Otherwise she is doing well.  Her son has taken a job out of state.  It is now harder for her to get in and see me because she had depended on him to drive her to her appointments.  She does not drive herself.  There is a bus stop about a mile from her home but she cannot walk there; it is too far.   She is fasting this am.  She is not checking her glucose at home She also needs a TSH check  She does notice some heartburn at night.  However admits she has begun eating her evening meal later- around 9pm- and this seems to have made her sx worse.  Also she sometimes notes that dry foods like bread will get stuck in her throat when she tries to swallow.  She has never had a colonoscopy  Last pneumovax in 2009 per her chart.  She has not had the prevnar yet.   Mammogram is overdue  Patient Active Problem List   Diagnosis Date Noted  . HTN (hypertension) 05/15/2012  . Edema 05/15/2012  . Allergic rhinitis 05/15/2012  . Diabetes mellitus type II 05/15/2012  . Osteopenia 05/15/2012  . Depression 05/15/2012  . Hypothyroidism 05/15/2012  . GERD (gastroesophageal reflux disease) 05/15/2012  . Hypercholesterolemia 05/15/2012  . Vitamin d deficiency 05/15/2012    Past Medical History  Diagnosis Date  . Allergy   . Anemia   . Anxiety   . Arthritis   . Depression   . GERD (gastroesophageal reflux disease)   . Diabetes mellitus  without complication   . Osteoporosis   . Hypertension   . Thyroid disease   . Cataract     Past Surgical History  Procedure Laterality Date  . Cholecystectomy    . Abdominal hysterectomy      no cancer    History  Substance Use Topics  . Smoking status: Never Smoker   . Smokeless tobacco: Never Used  . Alcohol Use: No    Family History  Problem Relation Age of Onset  . Hypertension Mother   . COPD Father   . Arthritis Sister   . Diabetes Brother   . Cancer Maternal Grandfather     skin cancer  . Heart disease Paternal Grandfather   . Diabetes Sister     Allergies  Allergen Reactions  . Aspirin Anaphylaxis    Medication list has been reviewed and updated.  Current Outpatient Prescriptions on File Prior to Visit  Medication Sig Dispense Refill  . atenolol (TENORMIN) 25 MG tablet TAKE 1 TABLET EVERY DAY  90 tablet  3  . citalopram (CELEXA) 40 MG tablet TAKE 1 TABLET EVERY DAY.  May start with a 1/2 tablet for 2 weeks  90 tablet  3  . cycloSPORINE (RESTASIS) 0.05 % ophthalmic emulsion Place 1 drop  into both eyes 2 (two) times daily.  3 each  3  . furosemide (LASIX) 40 MG tablet TAKE 1 TABLET TWICE A DAY  180 tablet  3  . levothyroxine (SYNTHROID, LEVOTHROID) 125 MCG tablet TAKE 1 TABLET DAILY BEFORE BREAKFAST  90 tablet  3  . lovastatin (MEVACOR) 40 MG tablet TAKE 1 TABLET BY MOUTH DAILY  90 tablet  3  . metFORMIN (GLUCOPHAGE) 500 MG tablet TAKE 1 TABLET DAILY WITH BREAKFAST (NEED APPT)  90 tablet  3  . mometasone (NASONEX) 50 MCG/ACT nasal spray Place 2 sprays into the nose daily.  17 g  12  . potassium chloride (KLOR-CON 10) 10 MEQ tablet Take 1 tablet (10 mEq total) by mouth daily.  90 tablet  3  . quinapril (ACCUPRIL) 20 MG tablet Take one pill in the morning and a half pill at night.  135 tablet  3  . traZODone (DESYREL) 100 MG tablet Take 0.5 tablets (50 mg total) by mouth at bedtime.  45 tablet  3  . mometasone (NASONEX) 50 MCG/ACT nasal spray Place 2 sprays  into the nose daily.  17 g  12   No current facility-administered medications on file prior to visit.    Review of Systems:  As per HPI- otherwise negative.   Physical Examination: Filed Vitals:   02/10/14 1104  BP: 124/73  Pulse: 64  Temp: 98.5 F (36.9 C)  Resp: 16   Filed Vitals:   02/10/14 1104  Height: 5\' 4"  (1.626 m)  Weight: 241 lb (109.317 kg)   Body mass index is 41.35 kg/(m^2). Ideal Body Weight: Weight in (lb) to have BMI = 25: 145.3  GEN: WDWN, NAD, Non-toxic, A & O x 3, obese, looks well HEENT: Atraumatic, Normocephalic. Neck supple. No masses, No LAD. Ears and Nose: No external deformity. CV: RRR, No M/G/R. No JVD. No thrill. No extra heart sounds. PULM: CTA B, no wheezes, crackles, rhonchi. No retractions. No resp. distress. No accessory muscle use. ABD: S, NT, ND. No rebound. No HSM. EXTR: No c/c/e NEURO Normal gait.  PSYCH: Normally interactive. Conversant. Not depressed or anxious appearing.  Calm demeanor.   Results for orders placed in visit on 02/10/14  POCT GLYCOSYLATED HEMOGLOBIN (HGB A1C)      Result Value Ref Range   Hemoglobin A1C 5.6      Assessment and Plan: Type II or unspecified type diabetes mellitus without mention of complication, not stated as uncontrolled - Plan: CBC, Lipid panel, POCT glycosylated hemoglobin (Hb A1C), metFORMIN (GLUCOPHAGE) 500 MG tablet  Immunization due - Plan: Pneumococcal conjugate vaccine 13-valent IM  HTN (hypertension) - Plan: Comprehensive metabolic panel, atenolol (TENORMIN) 25 MG tablet, quinapril (ACCUPRIL) 20 MG tablet  Allergic rhinitis  Edema - Plan: furosemide (LASIX) 40 MG tablet, potassium chloride (KLOR-CON 10) 10 MEQ tablet  Unspecified hypothyroidism - Plan: TSH, levothyroxine (SYNTHROID, LEVOTHROID) 125 MCG tablet  Trouble swallowing - Plan: Ambulatory referral to Gastroenterology, CANCELED: Ambulatory referral to Gastroenterology  Depression - Plan: citalopram (CELEXA) 20 MG tablet,  traZODone (DESYREL) 100 MG tablet  High cholesterol - Plan: lovastatin (MEVACOR) 40 MG tablet  Given prevnar and an rx for zostavax (wait a month after prevnar) today BP continues to be well controlled, refill medications Excellent DM control, refilled metformin Referral to GI for trouble swallowing, also she needs a colonoscopy Printed out the [employer] bus paperwork and filled out the health professional section.  Encouraged her to apply for this service Will plan further follow- up pending labs.  Signed Lamar Blinks, MD

## 2014-02-11 ENCOUNTER — Encounter: Payer: Self-pay | Admitting: Family Medicine

## 2014-02-11 LAB — LIPID PANEL
CHOLESTEROL: 201 mg/dL — AB (ref 0–200)
HDL: 59 mg/dL (ref >39)
LDL Cholesterol: 115 mg/dL — ABNORMAL HIGH (ref 0–99)
TRIGLYCERIDES: 137 mg/dL (ref <150)
Total CHOL/HDL Ratio: 3.4 Ratio
VLDL: 27 mg/dL (ref 0–40)

## 2014-02-11 LAB — COMPREHENSIVE METABOLIC PANEL
ALBUMIN: 4.2 g/dL (ref 3.5–5.2)
ALK PHOS: 79 U/L (ref 39–117)
ALT: 9 U/L (ref 0–35)
AST: 14 U/L (ref 0–37)
BUN: 23 mg/dL (ref 6–23)
CO2: 26 mEq/L (ref 19–32)
CREATININE: 1.06 mg/dL (ref 0.50–1.10)
Calcium: 9.5 mg/dL (ref 8.4–10.5)
Chloride: 100 mEq/L (ref 96–112)
GLUCOSE: 129 mg/dL — AB (ref 70–99)
POTASSIUM: 4.7 meq/L (ref 3.5–5.3)
Sodium: 137 mEq/L (ref 135–145)
Total Bilirubin: 0.4 mg/dL (ref 0.2–1.2)
Total Protein: 7.5 g/dL (ref 6.0–8.3)

## 2014-02-11 LAB — TSH: TSH: 0.604 u[IU]/mL (ref 0.350–4.500)

## 2014-03-14 ENCOUNTER — Encounter: Payer: Self-pay | Admitting: Family Medicine

## 2014-03-30 DIAGNOSIS — E119 Type 2 diabetes mellitus without complications: Secondary | ICD-10-CM | POA: Diagnosis not present

## 2014-03-30 DIAGNOSIS — H5210 Myopia, unspecified eye: Secondary | ICD-10-CM | POA: Diagnosis not present

## 2014-03-30 DIAGNOSIS — H2510 Age-related nuclear cataract, unspecified eye: Secondary | ICD-10-CM | POA: Diagnosis not present

## 2014-03-30 DIAGNOSIS — H04129 Dry eye syndrome of unspecified lacrimal gland: Secondary | ICD-10-CM | POA: Diagnosis not present

## 2014-09-28 ENCOUNTER — Ambulatory Visit (INDEPENDENT_AMBULATORY_CARE_PROVIDER_SITE_OTHER): Payer: Medicare Other | Admitting: Family Medicine

## 2014-09-28 VITALS — BP 102/64 | HR 64 | Temp 98.8°F | Resp 16 | Ht 64.0 in | Wt 241.6 lb

## 2014-09-28 DIAGNOSIS — J302 Other seasonal allergic rhinitis: Secondary | ICD-10-CM | POA: Diagnosis not present

## 2014-09-28 DIAGNOSIS — E1169 Type 2 diabetes mellitus with other specified complication: Secondary | ICD-10-CM

## 2014-09-28 DIAGNOSIS — E785 Hyperlipidemia, unspecified: Secondary | ICD-10-CM | POA: Diagnosis not present

## 2014-09-28 DIAGNOSIS — E669 Obesity, unspecified: Secondary | ICD-10-CM | POA: Diagnosis not present

## 2014-09-28 DIAGNOSIS — E038 Other specified hypothyroidism: Secondary | ICD-10-CM

## 2014-09-28 DIAGNOSIS — Z23 Encounter for immunization: Secondary | ICD-10-CM | POA: Diagnosis not present

## 2014-09-28 DIAGNOSIS — E119 Type 2 diabetes mellitus without complications: Secondary | ICD-10-CM | POA: Diagnosis not present

## 2014-09-28 LAB — LIPID PANEL
Cholesterol: 195 mg/dL (ref 0–200)
HDL: 65 mg/dL (ref >39)
LDL CALC: 102 mg/dL — AB (ref 0–99)
Total CHOL/HDL Ratio: 3 Ratio
Triglycerides: 138 mg/dL (ref <150)
VLDL: 28 mg/dL (ref 0–40)

## 2014-09-28 LAB — BASIC METABOLIC PANEL
BUN: 28 mg/dL — AB (ref 6–23)
CHLORIDE: 101 meq/L (ref 96–112)
CO2: 26 meq/L (ref 19–32)
Calcium: 9.3 mg/dL (ref 8.4–10.5)
Creat: 1.16 mg/dL — ABNORMAL HIGH (ref 0.50–1.10)
Glucose, Bld: 125 mg/dL — ABNORMAL HIGH (ref 70–99)
Potassium: 4.5 mEq/L (ref 3.5–5.3)
SODIUM: 139 meq/L (ref 135–145)

## 2014-09-28 LAB — TSH: TSH: 1.729 u[IU]/mL (ref 0.350–4.500)

## 2014-09-28 MED ORDER — MOMETASONE FUROATE 50 MCG/ACT NA SUSP: 2.0000 | Freq: Every day | NASAL | Status: DC

## 2014-09-28 NOTE — Patient Instructions (Addendum)
It is time to schedule your screening mammogram and colonoscopy!  Let me know if you need anything in this regard. I will be in touch with your labs via letter Please plan to see me in about 6 months for a recheck Happy new year!  Always great to see you

## 2014-09-28 NOTE — Progress Notes (Signed)
[facility] [address] [place] [address] [phone]  Date:  09/28/2014   Name:  Sandra Hughes   DOB:  04-27-1941   MRN:  [id number]  PCP:  Kennon Portela, MD    Chief Complaint: Flu Shot and Bloodwork   History of Present Illness:  Sandra Hughes is a 73 y.o. very pleasant female patient who presents with the following:  Here today for a flu shot and labs.  Mammogram is a bit over-due; she knows that she needs to have this and a colonoscopy (last at age 8).  Prevnar done in 01/2014.   She is fasting today for labs.   No other concerns, she is doing well overall  BP Readings from Last 3 Encounters:  09/28/14 102/64  02/10/14 124/73  05/26/13 118/66     Patient Active Problem List   Diagnosis Date Noted  . HTN (hypertension) 05/15/2012  . Edema 05/15/2012  . Allergic rhinitis 05/15/2012  . Diabetes mellitus type II 05/15/2012  . Osteopenia 05/15/2012  . Depression 05/15/2012  . Hypothyroidism 05/15/2012  . GERD (gastroesophageal reflux disease) 05/15/2012  . Hypercholesterolemia 05/15/2012  . Vitamin d deficiency 05/15/2012    Past Medical History  Diagnosis Date  . Allergy   . Anemia   . Anxiety   . Arthritis   . Depression   . GERD (gastroesophageal reflux disease)   . Diabetes mellitus without complication   . Osteoporosis   . Hypertension   . Thyroid disease   . Cataract     Past Surgical History  Procedure Laterality Date  . Cholecystectomy    . Abdominal hysterectomy      no cancer    History  Substance Use Topics  . Smoking status: Never Smoker   . Smokeless tobacco: Never Used  . Alcohol Use: No    Family History  Problem Relation Age of Onset  . Hypertension Mother   . COPD Father   . Arthritis Sister   . Diabetes Brother   . Cancer Maternal Grandfather     skin cancer  . Heart disease Paternal Grandfather   . Diabetes Sister     Allergies  Allergen Reactions  . Aspirin Anaphylaxis     Medication list has been reviewed and updated.  Current Outpatient Prescriptions on File Prior to Visit  Medication Sig Dispense Refill  . atenolol (TENORMIN) 25 MG tablet TAKE 1 TABLET EVERY DAY 90 tablet 3  . citalopram (CELEXA) 20 MG tablet Take 1 daily 90 tablet 3  . cycloSPORINE (RESTASIS) 0.05 % ophthalmic emulsion Place 1 drop into both eyes 2 (two) times daily. 3 each 3  . furosemide (LASIX) 40 MG tablet TAKE 1 TABLET TWICE A DAY 180 tablet 3  . levothyroxine (SYNTHROID, LEVOTHROID) 125 MCG tablet TAKE 1 TABLET DAILY BEFORE BREAKFAST 90 tablet 3  . lovastatin (MEVACOR) 40 MG tablet TAKE 1 TABLET BY MOUTH DAILY 90 tablet 3  . metFORMIN (GLUCOPHAGE) 500 MG tablet TAKE 1 TABLET DAILY WITH BREAKFAST 90 tablet 3  . mometasone (NASONEX) 50 MCG/ACT nasal spray Place 2 sprays into the nose daily. 17 g 12  . potassium chloride (KLOR-CON 10) 10 MEQ tablet Take 1 tablet (10 mEq total) by mouth daily. 90 tablet 3  . quinapril (ACCUPRIL) 20 MG tablet Take one pill in the morning and a half pill at night. 135 tablet 3  . traZODone (DESYREL) 100 MG tablet Take 0.5 tablets (50 mg total) by mouth at bedtime.  45 tablet 3   No current facility-administered medications on file prior to visit.    Review of Systems:  As per HPI- otherwise negative.   Physical Examination: Filed Vitals:   09/28/14 0908  BP: 102/64  Pulse: 64  Temp: 98.8 F (37.1 C)  Resp: 16   Filed Vitals:   09/28/14 0908  Height: 5\' 4"  (1.626 m)  Weight: 241 lb 9.6 oz (109.589 kg)   Body mass index is 41.45 kg/(m^2). Ideal Body Weight: Weight in (lb) to have BMI = 25: 145.3  GEN: WDWN, NAD, Non-toxic, A & O x 3, obese, looks well HEENT: Atraumatic, Normocephalic. Neck supple. No masses, No LAD.  Bilateral TM wnl, oropharynx normal.  PEERL,EOMI.   Ears and Nose: No external deformity. CV: RRR, No M/G/R. No JVD. No thrill. No extra heart sounds. PULM: CTA B, no wheezes, crackles, rhonchi. No retractions. No resp.  distress. No accessory muscle use. EXTR: No c/c/e NEURO Normal gait.  PSYCH: Normally interactive. Conversant. Not depressed or anxious appearing.  Calm demeanor.  Foot exam normal bilaterally  Assessment and Plan: Diabetes mellitus type 2 in obese - Plan: Basic metabolic panel, Hemoglobin A1c  Other seasonal allergic rhinitis - Plan: mometasone (NASONEX) 50 MCG/ACT nasal spray  Dyslipidemia - Plan: Lipid panel  Other specified hypothyroidism - Plan: TSH  Flu vaccine need - Plan: Flu Vaccine QUAD 36+ mos IM  Await labs, refilled her flonase, flu shot today.  Assuming labs look good plan for recheck in 6 months   Lab Results  Component Value Date   HGBA1C 5.6 02/10/2014     Signed Lamar Blinks, MD

## 2014-10-04 ENCOUNTER — Telehealth: Payer: Self-pay | Admitting: *Deleted

## 2014-10-04 DIAGNOSIS — E119 Type 2 diabetes mellitus without complications: Secondary | ICD-10-CM

## 2014-10-04 LAB — HEMOGLOBIN A1C

## 2014-10-04 NOTE — Telephone Encounter (Signed)
Pt will be back in on Saturday for A1C only.  Lavender was not sent out.

## 2014-10-06 ENCOUNTER — Encounter: Payer: Self-pay | Admitting: Family Medicine

## 2014-10-08 ENCOUNTER — Other Ambulatory Visit (INDEPENDENT_AMBULATORY_CARE_PROVIDER_SITE_OTHER): Payer: Medicare Other | Admitting: *Deleted

## 2014-10-08 ENCOUNTER — Telehealth: Payer: Self-pay | Admitting: Family Medicine

## 2014-10-08 DIAGNOSIS — E119 Type 2 diabetes mellitus without complications: Secondary | ICD-10-CM | POA: Diagnosis not present

## 2014-10-08 LAB — POCT GLYCOSYLATED HEMOGLOBIN (HGB A1C): HEMOGLOBIN A1C: 5.7

## 2014-10-08 NOTE — Progress Notes (Unsigned)
Pt here for labs only. 

## 2014-10-09 NOTE — Telephone Encounter (Signed)
See lab pool message

## 2015-03-02 ENCOUNTER — Other Ambulatory Visit: Payer: Self-pay | Admitting: Family Medicine

## 2015-03-10 ENCOUNTER — Encounter: Payer: Self-pay | Admitting: *Deleted

## 2015-03-31 ENCOUNTER — Telehealth: Payer: Self-pay

## 2015-03-31 MED ORDER — CITALOPRAM HYDROBROMIDE 20 MG PO TABS: 20.0000 mg | ORAL_TABLET | Freq: Every day | ORAL | Status: DC

## 2015-03-31 MED ORDER — ATENOLOL 25 MG PO TABS: 25.0000 mg | ORAL_TABLET | Freq: Every day | ORAL | Status: DC

## 2015-03-31 MED ORDER — LEVOTHYROXINE SODIUM 125 MCG PO TABS: 125.0000 ug | ORAL_TABLET | Freq: Every day | ORAL | Status: DC

## 2015-03-31 MED ORDER — LOVASTATIN 40 MG PO TABS: 40.0000 mg | ORAL_TABLET | Freq: Every day | ORAL | Status: DC

## 2015-03-31 MED ORDER — METFORMIN HCL 500 MG PO TABS: 500.0000 mg | ORAL_TABLET | Freq: Every day | ORAL | Status: DC

## 2015-03-31 MED ORDER — QUINAPRIL HCL 20 MG PO TABS: ORAL_TABLET | ORAL | Status: DC

## 2015-03-31 MED ORDER — FUROSEMIDE 40 MG PO TABS: 40.0000 mg | ORAL_TABLET | Freq: Two times a day (BID) | ORAL | Status: DC

## 2015-03-31 MED ORDER — POTASSIUM CHLORIDE ER 10 MEQ PO TBCR: 10.0000 meq | EXTENDED_RELEASE_TABLET | Freq: Every day | ORAL | Status: DC

## 2015-03-31 MED ORDER — TRAZODONE HCL 100 MG PO TABS: 50.0000 mg | ORAL_TABLET | Freq: Every day | ORAL | Status: DC

## 2015-03-31 NOTE — Telephone Encounter (Signed)
Last office visit 09/29/2015. I spoke with pt, advised she had to come in for additional refills. She will come into see Dr. Lorelei Pont in the walk-in clinic. I refilled her meds for one more month.

## 2015-03-31 NOTE — Telephone Encounter (Signed)
Pt states she got a refill on all her meds on 6/3; however, none of them have any refills with them. She needs refills attached to all the meds from 6/3. Please advise at [phone]

## 2015-04-08 ENCOUNTER — Encounter: Payer: Self-pay | Admitting: Family Medicine

## 2015-04-08 ENCOUNTER — Ambulatory Visit (INDEPENDENT_AMBULATORY_CARE_PROVIDER_SITE_OTHER): Payer: Medicare Other | Admitting: Family Medicine

## 2015-04-08 VITALS — BP 124/78 | HR 99 | Temp 98.2°F | Resp 16 | Ht 64.0 in | Wt 234.4 lb

## 2015-04-08 DIAGNOSIS — S60511A Abrasion of right hand, initial encounter: Secondary | ICD-10-CM | POA: Diagnosis not present

## 2015-04-08 DIAGNOSIS — E119 Type 2 diabetes mellitus without complications: Secondary | ICD-10-CM | POA: Diagnosis not present

## 2015-04-08 DIAGNOSIS — J301 Allergic rhinitis due to pollen: Secondary | ICD-10-CM | POA: Diagnosis not present

## 2015-04-08 DIAGNOSIS — E039 Hypothyroidism, unspecified: Secondary | ICD-10-CM

## 2015-04-08 DIAGNOSIS — E785 Hyperlipidemia, unspecified: Secondary | ICD-10-CM | POA: Diagnosis not present

## 2015-04-08 DIAGNOSIS — F32A Depression, unspecified: Secondary | ICD-10-CM

## 2015-04-08 DIAGNOSIS — Z23 Encounter for immunization: Secondary | ICD-10-CM

## 2015-04-08 DIAGNOSIS — S61401A Unspecified open wound of right hand, initial encounter: Secondary | ICD-10-CM

## 2015-04-08 DIAGNOSIS — I10 Essential (primary) hypertension: Secondary | ICD-10-CM

## 2015-04-08 DIAGNOSIS — F329 Major depressive disorder, single episode, unspecified: Secondary | ICD-10-CM

## 2015-04-08 LAB — COMPREHENSIVE METABOLIC PANEL
ALT: 10 U/L (ref 0–35)
AST: 15 U/L (ref 0–37)
Albumin: 4 g/dL (ref 3.5–5.2)
Alkaline Phosphatase: 64 U/L (ref 39–117)
BUN: 22 mg/dL (ref 6–23)
CHLORIDE: 102 meq/L (ref 96–112)
CO2: 26 meq/L (ref 19–32)
Calcium: 9 mg/dL (ref 8.4–10.5)
Creat: 1.16 mg/dL — ABNORMAL HIGH (ref 0.50–1.10)
Glucose, Bld: 122 mg/dL — ABNORMAL HIGH (ref 70–99)
Potassium: 4.2 mEq/L (ref 3.5–5.3)
SODIUM: 139 meq/L (ref 135–145)
Total Bilirubin: 0.4 mg/dL (ref 0.2–1.2)
Total Protein: 7.1 g/dL (ref 6.0–8.3)

## 2015-04-08 LAB — MICROALBUMIN, URINE: Microalb, Ur: 0.3 mg/dL (ref <2.0)

## 2015-04-08 LAB — LIPID PANEL
Cholesterol: 189 mg/dL (ref 0–200)
HDL: 59 mg/dL (ref >=46)
LDL Cholesterol: 93 mg/dL (ref 0–99)
Total CHOL/HDL Ratio: 3.2 Ratio
Triglycerides: 183 mg/dL — ABNORMAL HIGH (ref <150)
VLDL: 37 mg/dL (ref 0–40)

## 2015-04-08 LAB — HEMOGLOBIN A1C
Hgb A1c MFr Bld: 6.1 % — ABNORMAL HIGH (ref <5.7)
MEAN PLASMA GLUCOSE: 128 mg/dL — AB (ref <117)

## 2015-04-08 LAB — TSH: TSH: 0.54 u[IU]/mL (ref 0.350–4.500)

## 2015-04-08 MED ORDER — CITALOPRAM HYDROBROMIDE 20 MG PO TABS: 20.0000 mg | ORAL_TABLET | Freq: Every day | ORAL | Status: DC

## 2015-04-08 MED ORDER — MOMETASONE FUROATE 50 MCG/ACT NA SUSP: 2.0000 | Freq: Every day | NASAL | Status: DC

## 2015-04-08 MED ORDER — LEVOTHYROXINE SODIUM 125 MCG PO TABS: 125.0000 ug | ORAL_TABLET | Freq: Every day | ORAL | Status: DC

## 2015-04-08 MED ORDER — QUINAPRIL HCL 20 MG PO TABS
ORAL_TABLET | ORAL | Status: DC
Start: 2015-04-08 — End: 2016-05-02

## 2015-04-08 MED ORDER — METFORMIN HCL 500 MG PO TABS: 500.0000 mg | ORAL_TABLET | Freq: Every day | ORAL | Status: DC

## 2015-04-08 MED ORDER — POTASSIUM CHLORIDE ER 10 MEQ PO TBCR: 10.0000 meq | EXTENDED_RELEASE_TABLET | Freq: Every day | ORAL | Status: DC

## 2015-04-08 MED ORDER — LOVASTATIN 40 MG PO TABS: 40.0000 mg | ORAL_TABLET | Freq: Every day | ORAL | Status: DC

## 2015-04-08 MED ORDER — FUROSEMIDE 40 MG PO TABS: 40.0000 mg | ORAL_TABLET | Freq: Two times a day (BID) | ORAL | Status: DC

## 2015-04-08 MED ORDER — ATENOLOL 25 MG PO TABS: 25.0000 mg | ORAL_TABLET | Freq: Every day | ORAL | Status: DC

## 2015-04-08 MED ORDER — TRAZODONE HCL 100 MG PO TABS: 50.0000 mg | ORAL_TABLET | Freq: Every day | ORAL | Status: DC

## 2015-04-08 NOTE — Patient Instructions (Addendum)
I will be in touch with your labs As a diabetic, there are several things you can do to monitor your condition and maintain your health.  1. Check your feet daily for any skin breakdown 2. Exercise and keep track of your diet 3. Let us know before you run out of your medications 4. Get your annual flu shot, and ask if you need a pneumonia shot 5. Ask if you are up to date on your labs; you should have an A1c every 6 months, a urine protein test annually, and a cholesterol test annually.  Your doctor may decide to do labs more often if indicated 6. Take off your shoes and socks at each visit.  Be sure your doctor examines your feet.   7. Ask about your blood pressure.  Your goal is 130/ 80 or less 8. Get an annual eye exam.  Please ask your ophthalmologist to send [place] your report 9. Keep up with your dental cleanings and exams.   Pneumococcal Polysaccharide Vaccine: What You Need to Know 1. Pneumococcal disease Pneumococcal disease is caused by Streptococcus pneumoniae bacteria. It is a leading cause of vaccine-preventable illness and death in the [place]. Anyone can get pneumococcal disease, but some people are at greater risk than others:  People 80 years and older  The very young  People with certain health problems  People with a weakened immune system  Smokers Pneumococcal disease can lead to serious infections of the:  Lungs (pneumonia),  Blood (bacteremia), and  Covering of the brain (meningitis). Pneumococcal pneumonia kills about 1 out of 20 people who get it. Bacteremia kills about 1 person in 5, and meningitis about 3 people in 10.  People with the health problems described in Section 3 of this statement may be more likely to die from the disease. 2. Pneumococcal polysaccharide vaccine (PPSV) Treatment of pneumococcal infections with penicillin and other drugs used to be more effective. But some strains of the disease have become resistant to these drugs. This makes  prevention of the disease, through vaccination, even more important. Pneumococcal polysaccharide vaccine (PPSV) protects against 23 types of pneumococcal bacteria, including those most likely to cause serious disease. Most healthy adults who get the vaccine develop protection to most or all of these types within 2 to 3 weeks of getting the shot. Very old people, children under 16 years of age, and people with some long-term illnesses might not respond as well, or at all. Another type of pneumococcal vaccine (pneumococcal conjugate vaccine, or PCV) is routinely recommended for children younger than 56 years of age. PCV is described in a separate Vaccine Information Statement. 3. Who should get PPSV?  All adults 76 years of age and older.  Anyone 2 through 73 years of age who has a long-term health problem such as:  heart disease  lung disease  sickle cell disease  diabetes  alcoholism  cirrhosis  leaks of cerebrospinal fluid or cochlear implant  Anyone 2 through 73 years of age who has a disease or condition that lowers the body's resistance to infection, such as:  Hodgkin's disease  lymphoma or leukemia  kidney failure  multiple myeloma  nephrotic syndrome  HIV infection or AIDS  damaged spleen, or no spleen  organ transplant  Anyone 2 through 73 years of age who is taking a drug or treatment that lowers the body's resistance to infection, such as:  long-term steroids  certain cancer drugs  radiation therapy  Any adult 28 through 73  years of age who:  is a smoker  has asthma PPSV may be less effective for some people, especially those with lower resistance to infection. But these people should still be vaccinated, because they are more likely to have serious complications if they get pneumococcal disease. Children who often get ear infections, sinus infections, or other upper respiratory diseases, but who are otherwise healthy, do not need to get PPSV because it  is not effective against those conditions. 4. How many doses of PPSV are needed, and when? Usually only one dose of PPSV is needed, but under some circumstances a second dose may be given.  A second dose is recommended for people 65 years and older who got their first dose when they were younger than 54 and it has been 5 or more years since the first dose.  A second dose is recommended for people 2 through 73 years of age who:  have a damaged spleen or no spleen  have sickle-cell disease  have HIV infection or AIDS  have cancer, leukemia, lymphoma, multiple myeloma  have nephrotic syndrome  have had an organ or bone marrow transplant  are taking medication that lowers immunity (such as chemotherapy or long-term steroids) When a second dose is given, it should be given 5 years after the first dose. 5. Some people should not get PPSV or should wait  Anyone who has had a life-threatening allergic reaction to PPSV should not get another dose.  Anyone who has a severe allergy to any component of a vaccine should not get that vaccine. Tell your doctor if you have any severe allergies.  Anyone who is moderately or severely ill when the shot is scheduled may be asked to wait until they recover before getting the vaccine. Someone with a mild illness can usually be vaccinated.  While there is no evidence that PPSV is harmful to either a pregnant woman or to her fetus, as a precaution, women with conditions that put them at risk for pneumococcal disease should be vaccinated before becoming pregnant, if possible. 6. What are the risks from PPSV? About half of people who get PPSV have mild side effects, such as redness or pain where the shot is given. Less than 1% develop a fever, muscle aches, or more severe local reactions. A vaccine, like any medicine, could cause a serious reaction. But the risk of a vaccine causing serious harm, or death, is extremely small. 7. What if there is a serious  reaction? What should I look for?  Look for anything that concerns you, such as signs of a severe allergic reaction, very high fever, or behavior changes. Signs of a severe allergic reaction can include hives, swelling of the face and throat, difficulty breathing, a fast heartbeat, dizziness, and weakness. These would start a few minutes to a few hours after the vaccination. What should I do?  If you think it is a severe allergic reaction or other emergency that can't wait, call 9-1-1 or get the person to the nearest hospital. Otherwise, call your doctor.  Afterward, the reaction should be reported to the Vaccine Adverse Event Reporting System (VAERS). Your doctor might file this report, or you can do it yourself through the VAERS web site at www.vaers.SamedayNews.es, or by calling 718-422-1987. VAERS is only for reporting reactions. They do not give medical advice. 8. How can I learn more?  Ask your doctor.  Call your local or state health department.  Contact the Centers for Disease Control and Prevention (  CDC):  Call 714 379 6677 (1-800-CDC-INFO) or  Visit CDC's website at http://hunter.com/ CDC Pneumococcal Polysaccharide Vaccine VIS (07/05/08) Document Released: 07/14/2006 Document Revised: 01/31/2014 Document Reviewed: 01/24/2014 [facility] Patient Information 2015 [place], [place]. This information is not intended to replace advice given to you by your health care provider. Make sure you discuss any questions you have with your health care provider. Td Vaccine (Tetanus and Diphtheria): What You Need to Know 1. Why get vaccinated? Tetanus  and diphtheria are very serious diseases. They are rare in the [place] today, but people who do become infected often have severe complications. Td vaccine is used to protect adolescents and adults from both of these diseases. Both tetanus and diphtheria are infections caused by bacteria. Diphtheria spreads from person to person through coughing  or sneezing. Tetanus-causing bacteria enter the body through cuts, scratches, or wounds. TETANUS (Lockjaw) causes painful muscle tightening and stiffness, usually all over the body.  It can lead to tightening of muscles in the head and neck so you can't open your mouth, swallow, or sometimes even breathe. Tetanus kills about 1 out of every 5 people who are infected. DIPHTHERIA can cause a thick coating to form in the back of the throat.  It can lead to breathing problems, paralysis, heart failure, and death. Before vaccines, the [place] saw as many as 200,000 cases a year of diphtheria and hundreds of cases of tetanus. Since vaccination began, cases of both diseases have dropped by about 99%. 2. Td vaccine Td vaccine can protect adolescents and adults from tetanus and diphtheria. Td is usually given as a booster dose every 10 years but it can also be given earlier after a severe and dirty wound or burn. Your doctor can give you more information. Td may safely be given at the same time as other vaccines. 3. Some people should not get this vaccine  If you ever had a life-threatening allergic reaction after a dose of any tetanus or diphtheria containing vaccine, OR if you have a severe allergy to any part of this vaccine, you should not get Td. Tell your doctor if you have any severe allergies.  Talk to your doctor if you:  have epilepsy or another nervous system problem,  had severe pain or swelling after any vaccine containing diphtheria or tetanus,  ever had Guillain Barr Syndrome (GBS),  aren't feeling well on the day the shot is scheduled. 4. Risks of a vaccine reaction With a vaccine, like any medicine, there is a chance of side effects. These are usually mild and go away on their own. Serious side effects are also possible, but are very rare. Most people who get Td vaccine do not have any problems with it. Mild Problems  following Td (Did not interfere with  activities)  Pain where the shot was given (about 8 people in 10)  Redness or swelling where the shot was given (about 1 person in 3)  Mild fever (about 1 person in 15)  Headache or Tiredness (uncommon) Moderate Problems following Td (Interfered with activities, but did not require medical attention)  Fever over 102F (rare) Severe Problems  following Td (Unable to perform usual activities; required medical attention)  Swelling, severe pain, bleeding and/or redness in the arm where the shot was given (rare). Problems that could happen after any vaccine:  Brief fainting spells can happen after any medical procedure, including vaccination. Sitting or lying down for about 15 minutes can help prevent fainting, and injuries caused by a fall. Tell  your doctor if you feel dizzy, or have vision changes or ringing in the ears.  Severe shoulder pain and reduced range of motion in the arm where a shot was given can happen, very rarely, after a vaccination.  Severe allergic reactions from a vaccine are very rare, estimated at less than 1 in a million doses. If one were to occur, it would usually be within a few minutes to a few hours after the vaccination. 5. What if there is a serious reaction? What should I look for?  Look for anything that concerns you, such as signs of a severe allergic reaction, very high fever, or behavior changes. Signs of a severe allergic reaction can include hives, swelling of the face and throat, difficulty breathing, a fast heartbeat, dizziness, and weakness. These would usually start a few minutes to a few hours after the vaccination. What should I do?  If you think it is a severe allergic reaction or other emergency that can't wait, call 9-1-1 or get the person to the nearest hospital. Otherwise, call your doctor.  Afterward, the reaction should be reported to the Vaccine Adverse Event Reporting System (VAERS). Your doctor might file this report, or you can do it  yourself through the VAERS web site at www.vaers.SamedayNews.es, or by calling 831 246 0291. VAERS is only for reporting reactions. They do not give medical advice. 6. The National Vaccine Injury Compensation Program The [employer] Vaccine Injury Compensation Program (VICP) is a federal program that was created to compensate people who may have been injured by certain vaccines. Persons who believe they may have been injured by a vaccine can learn about the program and about filing a claim by calling 918 827 0579 or visiting the [place] website at GoldCloset.com.ee. 7. How can I learn more?  Ask your doctor.  Contact your local or state health department.  Contact the Centers for Disease Control and Prevention (CDC):  Call (250) 052-9403 (1-800-CDC-INFO)  Visit CDC's website at http://hunter.com/ CDC Td Vaccine Interim VIS (11/03/12) Document Released: 07/14/2006 Document Revised: 01/31/2014 Document Reviewed: 12/29/2013 [facility] Patient Information 2015 [place], [place]. This information is not intended to replace advice given to you by your health care provider. Make sure you discuss any questions you have with your health care provider.

## 2015-04-08 NOTE — Progress Notes (Signed)
[facility] [address] [place] [address] [phone]  Date:  04/08/2015   Name:  Sandra Hughes   DOB:  09/30/1941   MRN:  [id number]  PCP:  Kennon Portela, MD    Chief Complaint: Medication Refill   History of Present Illness:  Sandra Hughes is a 74 y.o. very pleasant female patient who presents with the following:  She is here today for a periodic recheck.  She has DM, HTN, hypothyroidism. She is having some trouble with her back but it is improved since she first injured it about 2 months ago.  She had sudden pain upon standing up- wonders if she has a lumbar compression fx  She is fasting today for labs.  She would like her 2nd pneumonia vaccine today.  She plans to do a mammogram soon, and to see her eye doctor. She had a recent abrasion on her right hand is and due for a tetanus shot also Lab Results  Component Value Date   HGBA1C 5.7 10/08/2014     Patient Active Problem List   Diagnosis Date Noted  . HTN (hypertension) 05/15/2012  . Edema 05/15/2012  . Allergic rhinitis 05/15/2012  . Diabetes mellitus type II 05/15/2012  . Osteopenia 05/15/2012  . Depression 05/15/2012  . Hypothyroidism 05/15/2012  . GERD (gastroesophageal reflux disease) 05/15/2012  . Hypercholesterolemia 05/15/2012  . Vitamin D deficiency 05/15/2012    Past Medical History  Diagnosis Date  . Allergy   . Anemia   . Anxiety   . Arthritis   . Depression   . GERD (gastroesophageal reflux disease)   . Diabetes mellitus without complication   . Osteoporosis   . Hypertension   . Thyroid disease   . Cataract     Past Surgical History  Procedure Laterality Date  . Cholecystectomy    . Abdominal hysterectomy      no cancer    History  Substance Use Topics  . Smoking status: Never Smoker   . Smokeless tobacco: Never Used  . Alcohol Use: No    Family History  Problem Relation Age of Onset  . Hypertension Mother   . COPD Father   . Arthritis  Sister   . Diabetes Brother   . Cancer Maternal Grandfather     skin cancer  . Heart disease Paternal Grandfather   . Diabetes Sister     Allergies  Allergen Reactions  . Aspirin Anaphylaxis    Medication list has been reviewed and updated.  Current Outpatient Prescriptions on File Prior to Visit  Medication Sig Dispense Refill  . atenolol (TENORMIN) 25 MG tablet Take 1 tablet (25 mg total) by mouth daily. PATIENT NEEDS OFFICE VISIT FOR ADDITIONAL REFILLS 30 tablet 0  . citalopram (CELEXA) 20 MG tablet Take 1 tablet (20 mg total) by mouth daily. PATIENT NEEDS OFFICE VISIT FOR ADDITIONAL REFILLS 30 tablet 0  . cycloSPORINE (RESTASIS) 0.05 % ophthalmic emulsion Place 1 drop into both eyes 2 (two) times daily. PATIENT NEEDS OFFICE VISIT FOR ADDITIONAL REFILLS 1 each 0  . furosemide (LASIX) 40 MG tablet Take 1 tablet (40 mg total) by mouth 2 (two) times daily. PATIENT NEEDS OFFICE VISIT FOR ADDITIONAL REFILLS 60 tablet 0  . levothyroxine (SYNTHROID, LEVOTHROID) 125 MCG tablet Take 1 tablet (125 mcg total) by mouth daily before breakfast. PATIENT NEEDS OFFICE VISIT FOR ADDITIONAL REFILLS 30 tablet 0  . lovastatin (MEVACOR) 40 MG tablet Take 1 tablet (40 mg total) by mouth  daily. PATIENT NEEDS OFFICE VISIT FOR ADDITIONAL REFILLS 30 tablet 0  . metFORMIN (GLUCOPHAGE) 500 MG tablet Take 1 tablet (500 mg total) by mouth daily with breakfast. PATIENT NEEDS OFFICE VISIT FOR ADDITIONAL REFILLS 30 tablet 0  . mometasone (NASONEX) 50 MCG/ACT nasal spray Place 2 sprays into the nose daily. 17 g 12  . potassium chloride (KLOR-CON 10) 10 MEQ tablet Take 1 tablet (10 mEq total) by mouth daily. PATIENT NEEDS OFFICE VISIT FOR ADDITIONAL REFILLS 30 tablet 0  . quinapril (ACCUPRIL) 20 MG tablet Take 1 tablet by mouth every morning and 1/2 tablet at night. PATIENT NEEDS OFFICE VISIT FOR ADDITIONAL REFILLS 45 tablet 0  . traZODone (DESYREL) 100 MG tablet Take 0.5 tablets (50 mg total) by mouth at bedtime. PATIENT  NEEDS OFFICE VISIT FOR ADDITIONAL REFILLS 15 tablet 0   No current facility-administered medications on file prior to visit.    Review of Systems:  As per HPI- otherwise negative.   Physical Examination: Filed Vitals:   04/08/15 0807  BP: 124/78  Pulse: 99  Temp: 98.2 F (36.8 C)  Resp: 16   Filed Vitals:   04/08/15 0807  Height: 5\' 4"  (1.626 m)  Weight: 234 lb 6.4 oz (106.323 kg)   Body mass index is 40.21 kg/(m^2). Ideal Body Weight: Weight in (lb) to have BMI = 25: 145.3  GEN: WDWN, NAD, Non-toxic, A & O x 3, obese, looks well HEENT: Atraumatic, Normocephalic. Neck supple. No masses, No LAD. Ears and Nose: No external deformity. CV: RRR, No M/G/R. No JVD. No thrill. No extra heart sounds. PULM: CTA B, no wheezes, crackles, rhonchi. No retractions. No resp. distress. No accessory muscle use. ABD: S, NT, NDEXTR: No c/c/e NEURO Normal gait but is using a cane today She has tenderness over the lumbar spine PSYCH: Normally interactive. Conversant. Not depressed or anxious appearing.  Calm demeanor.   Offered to do x-rays but she declines today Assessment and Plan: Diabetes mellitus type 2, controlled - Plan: metFORMIN (GLUCOPHAGE) 500 MG tablet, Comprehensive metabolic panel, Microalbumin, urine, Hemoglobin A1c  Hyperlipidemia - Plan: lovastatin (MEVACOR) 40 MG tablet, Lipid panel  Immunization due - Plan: Td vaccine greater than or equal to 7yo preservative free IM, Pneumococcal polysaccharide vaccine 23-valent greater than or equal to 2yo subcutaneous/IM  Allergic rhinitis due to pollen - Plan: mometasone (NASONEX) 50 MCG/ACT nasal spray  Hypothyroidism, unspecified hypothyroidism type - Plan: levothyroxine (SYNTHROID, LEVOTHROID) 125 MCG tablet, TSH  Depression - Plan: traZODone (DESYREL) 100 MG tablet, citalopram (CELEXA) 20 MG tablet  Essential hypertension, benign - Plan: quinapril (ACCUPRIL) 20 MG tablet, potassium chloride (KLOR-CON 10) 10 MEQ tablet,  furosemide (LASIX) 40 MG tablet, atenolol (TENORMIN) 25 MG tablet, Comprehensive metabolic panel  Abrasion, hand, right, initial encounter - Plan: Td vaccine greater than or equal to 7yo preservative free IM  Open wound of hand with complication, right, initial encounter - Plan: Td vaccine greater than or equal to 7yo preservative free IM  Did her refills, await labs Updated td and pneumovax She will let me know if her back does not continue to improve   Signed Lamar Blinks, MD

## 2015-04-25 ENCOUNTER — Other Ambulatory Visit: Payer: Self-pay | Admitting: Family Medicine

## 2015-07-17 DIAGNOSIS — H43813 Vitreous degeneration, bilateral: Secondary | ICD-10-CM | POA: Diagnosis not present

## 2015-07-17 DIAGNOSIS — E119 Type 2 diabetes mellitus without complications: Secondary | ICD-10-CM | POA: Diagnosis not present

## 2015-11-12 ENCOUNTER — Encounter: Payer: Self-pay | Admitting: Family Medicine

## 2015-12-14 ENCOUNTER — Encounter: Payer: Self-pay | Admitting: Family Medicine

## 2015-12-14 ENCOUNTER — Ambulatory Visit (INDEPENDENT_AMBULATORY_CARE_PROVIDER_SITE_OTHER): Payer: Medicare Other | Admitting: Family Medicine

## 2015-12-14 VITALS — BP 118/70 | HR 57 | Temp 98.1°F | Ht 64.0 in | Wt 227.2 lb

## 2015-12-14 DIAGNOSIS — E039 Hypothyroidism, unspecified: Secondary | ICD-10-CM

## 2015-12-14 DIAGNOSIS — Z1239 Encounter for other screening for malignant neoplasm of breast: Secondary | ICD-10-CM | POA: Diagnosis not present

## 2015-12-14 DIAGNOSIS — Z5181 Encounter for therapeutic drug level monitoring: Secondary | ICD-10-CM

## 2015-12-14 DIAGNOSIS — Z130 Encounter for screening for diseases of the blood and blood-forming organs and certain disorders involving the immune mechanism: Secondary | ICD-10-CM | POA: Diagnosis not present

## 2015-12-14 DIAGNOSIS — E119 Type 2 diabetes mellitus without complications: Secondary | ICD-10-CM

## 2015-12-14 DIAGNOSIS — R0789 Other chest pain: Secondary | ICD-10-CM | POA: Diagnosis not present

## 2015-12-14 DIAGNOSIS — Z23 Encounter for immunization: Secondary | ICD-10-CM | POA: Diagnosis not present

## 2015-12-14 DIAGNOSIS — R1900 Intra-abdominal and pelvic swelling, mass and lump, unspecified site: Secondary | ICD-10-CM

## 2015-12-14 DIAGNOSIS — Z1231 Encounter for screening mammogram for malignant neoplasm of breast: Secondary | ICD-10-CM

## 2015-12-14 DIAGNOSIS — Z0000 Encounter for general adult medical examination without abnormal findings: Secondary | ICD-10-CM

## 2015-12-14 LAB — COMPREHENSIVE METABOLIC PANEL
ALT: 10 U/L (ref 0–35)
AST: 16 U/L (ref 0–37)
Albumin: 4 g/dL (ref 3.5–5.2)
Alkaline Phosphatase: 68 U/L (ref 39–117)
BUN: 22 mg/dL (ref 6–23)
CALCIUM: 9.4 mg/dL (ref 8.4–10.5)
CHLORIDE: 103 meq/L (ref 96–112)
CO2: 27 meq/L (ref 19–32)
CREATININE: 1.1 mg/dL (ref 0.40–1.20)
GFR: 51.68 mL/min — ABNORMAL LOW (ref >60.00)
Glucose, Bld: 129 mg/dL — ABNORMAL HIGH (ref 70–99)
Potassium: 4.1 mEq/L (ref 3.5–5.1)
SODIUM: 138 meq/L (ref 135–145)
Total Bilirubin: 0.4 mg/dL (ref 0.2–1.2)
Total Protein: 7.6 g/dL (ref 6.0–8.3)

## 2015-12-14 LAB — LIPID PANEL
CHOL/HDL RATIO: 3
Cholesterol: 187 mg/dL (ref 0–200)
HDL: 60.5 mg/dL (ref >39.00)
LDL Cholesterol: 93 mg/dL (ref 0–99)
NONHDL: 126.69
TRIGLYCERIDES: 166 mg/dL — AB (ref 0.0–149.0)
VLDL: 33.2 mg/dL (ref 0.0–40.0)

## 2015-12-14 LAB — CBC
HCT: 45.1 % (ref 36.0–46.0)
Hemoglobin: 15.2 g/dL — ABNORMAL HIGH (ref 12.0–15.0)
MCHC: 33.7 g/dL (ref 30.0–36.0)
MCV: 87.2 fl (ref 78.0–100.0)
PLATELETS: 314 10*3/uL (ref 150.0–400.0)
RBC: 5.17 Mil/uL — ABNORMAL HIGH (ref 3.87–5.11)
RDW: 14.4 % (ref 11.5–15.5)
WBC: 6.3 10*3/uL (ref 4.0–10.5)

## 2015-12-14 LAB — TSH: TSH: 0.36 u[IU]/mL (ref 0.35–4.50)

## 2015-12-14 LAB — HEMOGLOBIN A1C: Hgb A1c MFr Bld: 6.1 % (ref 4.6–6.5)

## 2015-12-14 NOTE — Progress Notes (Addendum)
[place] at [facility] [address], [place], [place] [address] [phone]  Date:  12/14/2015   Name:  Sandra Hughes   DOB:  Dec 18, 1940   MRN:  [id number]  PCP:  Lamar Blinks, MD    Chief Complaint: Annual Exam   History of Present Illness:  Sandra Hughes is a 75 y.o. very pleasant female patient who presents with the following:  History of HTN, Gerd, DM, depression, hypothyroid, hyperlipidemia Metformin for her DM- generally well controlled Lab Results  Component Value Date   HGBA1C 6.1* 04/08/2015   I last saw her in July.  She reports that she has noted "angina" November- January. This seemed to stop in January. She would notice chest pressure that would occur at any time- not just with activity.  She would also have associated jaw pains. This might last 5- 10 minutes and would occur most evenings.  This has stopped now.    She did see cardiology for similar sx when she was in her 49s.  Never had a cath. She does not keep nitro on hand. She had not been bothered with any recurrent CP symptoms until recently.   Also she had noted a small knot in her epigastric area for a few months, and some difficulty swallowing.   She has noted some pain with swallowing the last month or so, nothing gets stuck in her throat however Mammo is overdue, eye exam is UTD  She did not get a flu shot this year but she seems to have gotten throiugh the flu season.  She would like to get a shot today as we still have some left   She is fasting today for labs.    Patient Active Problem List   Diagnosis Date Noted  . HTN (hypertension) 05/15/2012  . Edema 05/15/2012  . Allergic rhinitis 05/15/2012  . Diabetes mellitus type II 05/15/2012  . Osteopenia 05/15/2012  . Depression 05/15/2012  . Hypothyroidism 05/15/2012  . GERD (gastroesophageal reflux disease) 05/15/2012  . Hypercholesterolemia 05/15/2012  . Vitamin D deficiency 05/15/2012     Past Medical History  Diagnosis Date  . Allergy   . Anemia   . Anxiety   . Arthritis   . Depression   . GERD (gastroesophageal reflux disease)   . Diabetes mellitus without complication ([place])   . Osteoporosis   . Hypertension   . Thyroid disease   . Cataract     Past Surgical History  Procedure Laterality Date  . Cholecystectomy    . Abdominal hysterectomy      no cancer    Social History  Substance Use Topics  . Smoking status: Never Smoker   . Smokeless tobacco: Never Used  . Alcohol Use: No    Family History  Problem Relation Age of Onset  . Hypertension Mother   . COPD Father   . Arthritis Sister   . Diabetes Brother   . Cancer Maternal Grandfather     skin cancer  . Heart disease Paternal Grandfather   . Diabetes Sister     Allergies  Allergen Reactions  . Aspirin Anaphylaxis    Medication list has been reviewed and updated.  Current Outpatient Prescriptions on File Prior to Visit  Medication Sig Dispense Refill  . atenolol (TENORMIN) 25 MG tablet Take 1 tablet (25 mg total) by mouth daily. 90 tablet 3  . citalopram (CELEXA) 20 MG tablet Take 1 tablet (20 mg total) by mouth  daily. 90 tablet 3  . cycloSPORINE (RESTASIS) 0.05 % ophthalmic emulsion Place 1 drop into both eyes 2 (two) times daily. PATIENT NEEDS OFFICE VISIT FOR ADDITIONAL REFILLS 1 each 0  . furosemide (LASIX) 40 MG tablet Take 1 tablet (40 mg total) by mouth 2 (two) times daily. 180 tablet 3  . levothyroxine (SYNTHROID, LEVOTHROID) 125 MCG tablet Take 1 tablet (125 mcg total) by mouth daily before breakfast. 90 tablet 3  . lovastatin (MEVACOR) 40 MG tablet Take 1 tablet (40 mg total) by mouth daily. 90 tablet 3  . metFORMIN (GLUCOPHAGE) 500 MG tablet Take 1 tablet (500 mg total) by mouth daily with breakfast. 90 tablet 3  . potassium chloride (KLOR-CON 10) 10 MEQ tablet Take 1 tablet (10 mEq total) by mouth daily. 90 tablet 3  . quinapril (ACCUPRIL) 20 MG tablet Take 1 tablet by mouth  every morning and 1/2 tablet at night.REFILLS 135 tablet 3  . traZODone (DESYREL) 100 MG tablet Take 0.5 tablets (50 mg total) by mouth at bedtime. 45 tablet 3  . mometasone (NASONEX) 50 MCG/ACT nasal spray Place 2 sprays into the nose daily. (Patient not taking: Reported on 12/14/2015) 17 g 12   No current facility-administered medications on file prior to visit.    Review of Systems:  As per HPI- otherwise negative.   Physical Examination: Filed Vitals:   12/14/15 0943  BP: 118/70  Pulse: 57  Temp: 98.1 F (36.7 C)   Filed Vitals:   12/14/15 0943  Height: 5\' 4"  (1.626 m)  Weight: 227 lb 3.2 oz (103.057 kg)   Body mass index is 38.98 kg/(m^2). Ideal Body Weight: Weight in (lb) to have BMI = 25: 145.3  GEN: WDWN, NAD, Non-toxic, A & O x 3, obese, looks well HEENT: Atraumatic, Normocephalic. Neck supple. No masses, No LAD.  Bilateral TM wnl, oropharynx normal.  PEERL,EOMI.   Ears and Nose: No external deformity. CV: RRR, No M/G/R. No JVD. No thrill. No extra heart sounds. PULM: CTA B, no wheezes, crackles, rhonchi. No retractions. No resp. distress. No accessory muscle use. ABD: S, NT, ND, +BS. No rebound. No HSM.  There is a superficial, rubbery mass, about 2cm by 2cm just inferior to the xyphoid. It seems like a lipoma EXTR: No c/c/e NEURO Normal gait.  PSYCH: Normally interactive. Conversant. Not depressed or anxious appearing.  Calm demeanor.  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 that are unchanged Assessment and Plan: Physical exam  Controlled type 2 diabetes mellitus without complication, without long-term current use of insulin ([place]) - Plan: Comprehensive metabolic panel, Lipid panel, Hemoglobin A1c  Other chest pain - Plan: EKG 12-Lead, Ambulatory referral to Cardiology  Immunization due - Plan: Flu Vaccine QUAD 36+ mos IM  Screening for breast cancer  Screening for  deficiency anemia - Plan: CBC  Medication monitoring encounter - Plan: CBC  Breast cancer screening, high risk patient - Plan: MM Digital Screening  Abdominal mass - Plan: US Abdomen Limited  Hypothyroidism, unspecified hypothyroidism type - Plan: TSH  Schedule mammogram for her Referral to cardiology for possible angina.  She is instructed to go to the ER if her pain comes back in the meantime [place] for mass in belly- suspect benign Flu shot today Await labs to monitor her DM Her BP is well controlled.  She has a hard time halving her 20 mg of accupril.  She will change to just 20  mg a day Monitor TSH today  Signed Lamar Blinks, MD  Received her [place] report and gave her a call.  Appears that she does have a lipoma but we can do an MRI for confirmation if she likes. She will think about it  US Abdomen Limited  12/21/2015  CLINICAL DATA:  Soft tissue mass. EXAM: LIMITED ABDOMINAL ULTRASOUND COMPARISON:  No prior. FINDINGS: A 6.2 x 2.2 x 4.5 cm rounded slightly hyperechoic soft tissue mass is noted region of palpable abnormality in the anterior right abdominal wall. This may represent a lipoma. Although abdominal hernia cannot be completely excluded, no bowel activity noted. No significant color flow. Further evaluation with gadolinium-enhanced MRI can be obtained for further evaluation. IMPRESSION: 6.2 x 2.2 x 4.5 cm rounded slightly hyperechoic soft tissue mass in the region of palpable abnormality in the anterior right abdominal wall . Findings most consistent with a lipoma. Further evaluation with gadolinium-enhanced MRI can be obtained for confirmation . Electronically Signed   By: Marcello Moores  Register   On: 12/21/2015 10:12   Results for orders placed or performed in visit on 12/14/15  CBC  Result Value Ref Range   WBC 6.3 4.0 - 10.5 K/uL   RBC 5.17 (H) 3.87 - 5.11 Mil/uL   Platelets 314.0 150.0 - 400.0 K/uL   Hemoglobin 15.2 (H) 12.0 - 15.0 g/dL   HCT 45.1 36.0 - 46.0 %   MCV 87.2  78.0 - 100.0 fl   MCHC 33.7 30.0 - 36.0 g/dL   RDW 14.4 11.5 - 15.5 %  Comprehensive metabolic panel  Result Value Ref Range   Sodium 138 135 - 145 mEq/L   Potassium 4.1 3.5 - 5.1 mEq/L   Chloride 103 96 - 112 mEq/L   CO2 27 19 - 32 mEq/L   Glucose, Bld 129 (H) 70 - 99 mg/dL   BUN 22 6 - 23 mg/dL   Creatinine, Ser 1.10 0.40 - 1.20 mg/dL   Total Bilirubin 0.4 0.2 - 1.2 mg/dL   Alkaline Phosphatase 68 39 - 117 U/L   AST 16 0 - 37 U/L   ALT 10 0 - 35 U/L   Total Protein 7.6 6.0 - 8.3 g/dL   Albumin 4.0 3.5 - 5.2 g/dL   Calcium 9.4 8.4 - 10.5 mg/dL   GFR 51.68 (L) >60.00 mL/min  Lipid panel  Result Value Ref Range   Cholesterol 187 0 - 200 mg/dL   Triglycerides 166.0 (H) 0.0 - 149.0 mg/dL   HDL 60.50 >39.00 mg/dL   VLDL 33.2 0.0 - 40.0 mg/dL   LDL Cholesterol 93 0 - 99 mg/dL   Total CHOL/HDL Ratio 3    NonHDL 126.69   Hemoglobin A1c  Result Value Ref Range   Hgb A1c MFr Bld 6.1 4.6 - 6.5 %  TSH  Result Value Ref Range   TSH 0.36 0.35 - 4.50 uIU/mL

## 2015-12-14 NOTE — Progress Notes (Signed)
Pre visit review using our clinic review tool, if applicable. No additional management support is needed unless otherwise documented below in the visit note. 

## 2015-12-14 NOTE — Patient Instructions (Addendum)
It was great to see you today!  I was not able to get a mammogram for you today but we will schedule this asap I am going to refer you to cardiology to check your heart.  If you have any further chest pains go to the ER! We will also arrange for an ultrasound to check on the lump you have noticed in your belly.   You can take just 20 mg of the accupril once a day- stop taking the 1/2 tablet at night

## 2015-12-21 ENCOUNTER — Ambulatory Visit (HOSPITAL_BASED_OUTPATIENT_CLINIC_OR_DEPARTMENT_OTHER)
Admission: RE | Admit: 2015-12-21 | Discharge: 2015-12-21 | Disposition: A | Discharge status: Home / Self Care | Payer: Medicare Other | Source: Ambulatory Visit | Attending: Family Medicine | Admitting: Family Medicine

## 2015-12-21 DIAGNOSIS — R928 Other abnormal and inconclusive findings on diagnostic imaging of breast: Secondary | ICD-10-CM | POA: Insufficient documentation

## 2015-12-21 DIAGNOSIS — R1900 Intra-abdominal and pelvic swelling, mass and lump, unspecified site: Secondary | ICD-10-CM

## 2015-12-21 DIAGNOSIS — R1909 Other intra-abdominal and pelvic swelling, mass and lump: Secondary | ICD-10-CM | POA: Insufficient documentation

## 2015-12-21 DIAGNOSIS — Z1239 Encounter for other screening for malignant neoplasm of breast: Secondary | ICD-10-CM

## 2015-12-21 DIAGNOSIS — Z1231 Encounter for screening mammogram for malignant neoplasm of breast: Secondary | ICD-10-CM | POA: Diagnosis not present

## 2015-12-22 ENCOUNTER — Encounter: Payer: Self-pay | Admitting: Family Medicine

## 2015-12-25 ENCOUNTER — Other Ambulatory Visit: Payer: Self-pay | Admitting: Family Medicine

## 2015-12-25 DIAGNOSIS — R928 Other abnormal and inconclusive findings on diagnostic imaging of breast: Secondary | ICD-10-CM

## 2015-12-27 ENCOUNTER — Telehealth: Payer: Self-pay | Admitting: *Deleted

## 2015-12-27 NOTE — Telephone Encounter (Signed)
Received physician order; forwarded to provider/SLS 03/29

## 2016-01-02 ENCOUNTER — Ambulatory Visit
Admission: RE | Admit: 2016-01-02 | Discharge: 2016-01-02 | Disposition: A | Discharge status: Home / Self Care | Payer: Medicare Other | Source: Ambulatory Visit | Attending: Family Medicine | Admitting: Family Medicine

## 2016-01-02 DIAGNOSIS — N6001 Solitary cyst of right breast: Secondary | ICD-10-CM | POA: Diagnosis not present

## 2016-01-02 DIAGNOSIS — N63 Unspecified lump in breast: Secondary | ICD-10-CM | POA: Diagnosis not present

## 2016-01-02 DIAGNOSIS — R928 Other abnormal and inconclusive findings on diagnostic imaging of breast: Secondary | ICD-10-CM

## 2016-03-04 ENCOUNTER — Encounter: Payer: Self-pay | Admitting: Internal Medicine

## 2016-03-04 ENCOUNTER — Ambulatory Visit (INDEPENDENT_AMBULATORY_CARE_PROVIDER_SITE_OTHER): Payer: Medicare Other | Admitting: Internal Medicine

## 2016-03-04 VITALS — BP 128/82 | HR 61 | Ht 64.0 in | Wt 224.1 lb

## 2016-03-04 DIAGNOSIS — R079 Chest pain, unspecified: Secondary | ICD-10-CM

## 2016-03-04 NOTE — Patient Instructions (Signed)
Your physician recommends that you continue on your current medications as directed. Please refer to the Current Medication list given to you today.  Your physician has requested that you have an echocardiogram. Echocardiography is a painless test that uses sound waves to create images of your heart. It provides your doctor with information about the size and shape of your heart and how well your heart's chambers and valves are working. This procedure takes approximately one hour. There are no restrictions for this procedure.  We will request that billing contact you regarding the cost of this echocardiogram.  Follow up with your physician will depend on test results.

## 2016-03-04 NOTE — Progress Notes (Signed)
Cardiology Office Note   Date:  03/04/2016   ID:  AUDRIEANNA Hughes, DOB Jun 03, 1941, MRN [id number]  PCP:  Lamar Blinks, MD  Cardiologist:   Dorris Carnes, MD   Pt referred by J Copland for CP     History of Present Illness: Sandra Hughes is a 75 y.o. female with a history of chest pain    Had CP during holidays  Holidays hard for her due to anniversaries of family deaths    She describes pain as a pressure sensation in chest  Went to throat and jaw   Since the holidays she denies CP     No cath in past  Had stress test about 10 yrs ago  Done in  [place]  MS She was told she may have spasm of the arteries    Carried NTG for 2 year  Went awy Has yearly CP at holidays   Doesn't do anything  Doesn't feel like it  Has back problmes     No Dizzines or presyncope     Outpatient Prescriptions Prior to Visit  Medication Sig Dispense Refill  . atenolol (TENORMIN) 25 MG tablet Take 1 tablet (25 mg total) by mouth daily. 90 tablet 3  . citalopram (CELEXA) 20 MG tablet Take 1 tablet (20 mg total) by mouth daily. 90 tablet 3  . cycloSPORINE (RESTASIS) 0.05 % ophthalmic emulsion Place 1 drop into both eyes 2 (two) times daily. PATIENT NEEDS OFFICE VISIT FOR ADDITIONAL REFILLS 1 each 0  . furosemide (LASIX) 40 MG tablet Take 1 tablet (40 mg total) by mouth 2 (two) times daily. 180 tablet 3  . levothyroxine (SYNTHROID, LEVOTHROID) 125 MCG tablet Take 1 tablet (125 mcg total) by mouth daily before breakfast. 90 tablet 3  . lovastatin (MEVACOR) 40 MG tablet Take 1 tablet (40 mg total) by mouth daily. 90 tablet 3  . metFORMIN (GLUCOPHAGE) 500 MG tablet Take 1 tablet (500 mg total) by mouth daily with breakfast. 90 tablet 3  . mometasone (NASONEX) 50 MCG/ACT nasal spray Place 2 sprays into the nose daily. 17 g 12  . potassium chloride (KLOR-CON 10) 10 MEQ tablet Take 1 tablet (10 mEq total) by mouth daily. 90 tablet 3  . quinapril (ACCUPRIL) 20 MG tablet Take 1 tablet by mouth every morning and  1/2 tablet at night.REFILLS 135 tablet 3  . traZODone (DESYREL) 100 MG tablet Take 0.5 tablets (50 mg total) by mouth at bedtime. 45 tablet 3   No facility-administered medications prior to visit.     Allergies:   Aspirin   Past Medical History  Diagnosis Date  . Allergy   . Anemia   . Anxiety   . Arthritis   . Depression   . GERD (gastroesophageal reflux disease)   . Diabetes mellitus without complication ([place])   . Osteoporosis   . Hypertension   . Thyroid disease   . Cataract     Past Surgical History  Procedure Laterality Date  . Cholecystectomy    . Abdominal hysterectomy      no cancer     Social History:  The patient  reports that she has never smoked. She has never used smokeless tobacco. She reports that she does not drink alcohol or use illicit drugs.   Family History:  The patient's family history includes Arthritis in her sister; COPD in her father; Cancer in her maternal grandfather; Diabetes in her brother and sister; Heart disease in her paternal grandfather; Hypertension in her mother.  ROS:  Please see the history of present illness. All other systems are reviewed and  Negative to the above problem except as noted.    PHYSICAL EXAM: VS:  BP 128/82 mmHg  Pulse 61  Ht 5\' 4"  (1.626 m)  Wt 224 lb 1.9 oz (101.66 kg)  BMI 38.45 kg/m2  SpO2 95%  GEN: Morbidly obese 75 yo  in no acute distress HEENT: normal Neck: no JVD, carotid bruits, or masses Cardiac: RRR; no murmurs, rubs, or gallops,no edema  Respiratory:  clear to auscultation bilaterally, normal work of breathing GI: soft, nontender, nondistended, + BS  No hepatomegaly  MS: no deformity Moving all extremities   Skin: warm and dry, no rash Neuro:  Strength and sensation are intact Psych: euthymic mood, full affect   EKG:  EKG is ordered today.  SB 54 bpm  RBBB   Lipid Panel    Component Value Date/Time   CHOL 187 12/14/2015 1045   TRIG 166.0* 12/14/2015 1045   HDL 60.50 12/14/2015  1045   CHOLHDL 3 12/14/2015 1045   VLDL 33.2 12/14/2015 1045   LDLCALC 93 12/14/2015 1045      Wt Readings from Last 3 Encounters:  03/04/16 224 lb 1.9 oz (101.66 kg)  12/14/15 227 lb 3.2 oz (103.057 kg)  04/08/15 234 lb 6.4 oz (106.323 kg)      ASSESSMENT AND PLAN:  1  CP  Atypical for cardiac  Sounds more GI with stress as exacerbation  BUt she has risk factors for CAD  AND she is not active. I would recomm echo to evla LV function  If windows are OK I would recomm a dobutamine echo to eval for ischemia   No change in meds     2  DM  Continue on oral agents  3.  HL  ON statin  Rx agressively    F/U will be based on test results     Signed, Dorris Carnes, MD  03/04/2016 10:18 AM    [address], [place], [place]  [address] Phone: [phone]; Fax: [phone]

## 2016-03-21 ENCOUNTER — Ambulatory Visit (HOSPITAL_COMMUNITY): Payer: Medicare Other | Attending: Cardiology

## 2016-03-21 ENCOUNTER — Other Ambulatory Visit: Payer: Self-pay

## 2016-03-21 DIAGNOSIS — I059 Rheumatic mitral valve disease, unspecified: Secondary | ICD-10-CM | POA: Insufficient documentation

## 2016-03-21 DIAGNOSIS — E119 Type 2 diabetes mellitus without complications: Secondary | ICD-10-CM | POA: Diagnosis not present

## 2016-03-21 DIAGNOSIS — I358 Other nonrheumatic aortic valve disorders: Secondary | ICD-10-CM | POA: Insufficient documentation

## 2016-03-21 DIAGNOSIS — R079 Chest pain, unspecified: Secondary | ICD-10-CM | POA: Diagnosis not present

## 2016-03-21 DIAGNOSIS — I119 Hypertensive heart disease without heart failure: Secondary | ICD-10-CM | POA: Diagnosis not present

## 2016-03-21 LAB — ECHOCARDIOGRAM COMPLETE
E decel time: 282 msec
EERAT: 9.72
FS: 26 % — AB (ref 28–44)
IV/PV OW: 0.81
LA diam end sys: 39 mm
LA diam index: 1.9 cm/m2
LA vol A4C: 31 ml
LA vol index: 19 mL/m2
LA vol: 39 mL
LASIZE: 39 mm
LV E/e'average: 9.72
LV TDI E'LATERAL: 8.99
LV TDI E'MEDIAL: 5.7
LV e' LATERAL: 8.99 cm/s
LVEEMED: 9.72
LVOT VTI: 22.6 cm
LVOT area: 3.14 cm2
LVOT diameter: 20 mm
LVOTPV: 108 cm/s
LVOTSV: 71 mL
MV Dec: 282
MV pk A vel: 99.2 m/s
MV pk E vel: 87.4 m/s
MVPG: 3 mmHg
PW: 12.2 mm — AB (ref 0.6–1.1)
Reg peak vel: 209 cm/s
TR max vel: 209 cm/s

## 2016-04-08 ENCOUNTER — Telehealth: Payer: Self-pay | Admitting: Internal Medicine

## 2016-04-08 DIAGNOSIS — R079 Chest pain, unspecified: Secondary | ICD-10-CM

## 2016-04-08 NOTE — Telephone Encounter (Signed)
New message ° ° ° ° ° °Calling to get echo results °

## 2016-04-08 NOTE — Telephone Encounter (Signed)
Notes Recorded by Rodman Key, RN on 04/08/2016 at 6:37 PM Reviewed with patient. Advised someone will be calling to set up dobutamine echo. She verbalizes understanding and agreement. Notes Recorded by Fay Records, MD on 03/24/2016 at 9:27 AM Echo shows normal pumping function of the heart I would set up for dobutamine echo to eval for ischemia as cause of symptoms

## 2016-04-26 ENCOUNTER — Telehealth (HOSPITAL_COMMUNITY): Payer: Self-pay | Admitting: *Deleted

## 2016-04-26 NOTE — Telephone Encounter (Signed)
Patient given detailed instructions per Stress Test Requisition Sheet for test on 04/29/16 at 7:30.Patient Notified to arrive 30 minutes early, and that it is imperative to arrive on time for appointment to keep from having the test rescheduled.  Patient verbalized understanding. Sandra Hughes

## 2016-04-29 ENCOUNTER — Ambulatory Visit (HOSPITAL_COMMUNITY): Payer: Medicare Other | Attending: Cardiovascular Disease

## 2016-04-29 ENCOUNTER — Other Ambulatory Visit (HOSPITAL_COMMUNITY): Payer: Self-pay | Admitting: *Deleted

## 2016-04-29 ENCOUNTER — Encounter (INDEPENDENT_AMBULATORY_CARE_PROVIDER_SITE_OTHER): Payer: Self-pay

## 2016-04-29 DIAGNOSIS — R079 Chest pain, unspecified: Secondary | ICD-10-CM

## 2016-04-29 DIAGNOSIS — I10 Essential (primary) hypertension: Secondary | ICD-10-CM | POA: Insufficient documentation

## 2016-04-29 DIAGNOSIS — E079 Disorder of thyroid, unspecified: Secondary | ICD-10-CM | POA: Diagnosis not present

## 2016-04-29 DIAGNOSIS — E119 Type 2 diabetes mellitus without complications: Secondary | ICD-10-CM | POA: Diagnosis not present

## 2016-04-29 MED ORDER — DOBUTAMINE HCL 250 MG/20ML IV SOLN: 20.0000 ug/kg | Freq: Once | INTRAVENOUS | Status: DC

## 2016-04-29 MED ORDER — SODIUM CHLORIDE 0.9 % IV SOLN: 20.0000 ug/kg | Freq: Once | INTRAVENOUS | Status: DC

## 2016-04-29 MED ORDER — SODIUM CHLORIDE 0.9 % IV SOLN
20.0000 ug/kg | Freq: Once | INTRAVENOUS | Status: AC
  Administered 2016-04-29: 20 meq/kg/min via INTRAVENOUS

## 2016-05-02 ENCOUNTER — Telehealth: Payer: Self-pay | Admitting: Family Medicine

## 2016-05-02 DIAGNOSIS — I10 Essential (primary) hypertension: Secondary | ICD-10-CM

## 2016-05-02 DIAGNOSIS — E785 Hyperlipidemia, unspecified: Secondary | ICD-10-CM

## 2016-05-02 DIAGNOSIS — F329 Major depressive disorder, single episode, unspecified: Secondary | ICD-10-CM

## 2016-05-02 DIAGNOSIS — J302 Other seasonal allergic rhinitis: Secondary | ICD-10-CM

## 2016-05-02 DIAGNOSIS — F32A Depression, unspecified: Secondary | ICD-10-CM

## 2016-05-02 DIAGNOSIS — E039 Hypothyroidism, unspecified: Secondary | ICD-10-CM

## 2016-05-02 DIAGNOSIS — E119 Type 2 diabetes mellitus without complications: Secondary | ICD-10-CM

## 2016-05-02 MED ORDER — QUINAPRIL HCL 20 MG PO TABS: ORAL_TABLET | ORAL | 3 refills | Status: DC

## 2016-05-02 MED ORDER — LOVASTATIN 40 MG PO TABS
40.0000 mg | ORAL_TABLET | Freq: Every day | ORAL | 3 refills | Status: DC
Start: 2016-05-02 — End: 2017-04-18

## 2016-05-02 MED ORDER — POTASSIUM CHLORIDE ER 10 MEQ PO TBCR: 10.0000 meq | EXTENDED_RELEASE_TABLET | Freq: Every day | ORAL | 3 refills | Status: DC

## 2016-05-02 MED ORDER — MOMETASONE FUROATE 50 MCG/ACT NA SUSP: 2.0000 | Freq: Every day | NASAL | 12 refills | Status: DC

## 2016-05-02 MED ORDER — TRAZODONE HCL 100 MG PO TABS: 50.0000 mg | ORAL_TABLET | Freq: Every day | ORAL | 3 refills | Status: DC

## 2016-05-02 MED ORDER — CITALOPRAM HYDROBROMIDE 20 MG PO TABS: 20.0000 mg | ORAL_TABLET | Freq: Every day | ORAL | 3 refills | Status: DC

## 2016-05-02 MED ORDER — METFORMIN HCL 500 MG PO TABS: 500.0000 mg | ORAL_TABLET | Freq: Every day | ORAL | 3 refills | Status: DC

## 2016-05-02 MED ORDER — LEVOTHYROXINE SODIUM 125 MCG PO TABS: 125.0000 ug | ORAL_TABLET | Freq: Every day | ORAL | 3 refills | Status: DC

## 2016-05-02 MED ORDER — FUROSEMIDE 40 MG PO TABS: 40.0000 mg | ORAL_TABLET | Freq: Two times a day (BID) | ORAL | 3 refills | Status: DC

## 2016-05-02 MED ORDER — ATENOLOL 25 MG PO TABS: 25.0000 mg | ORAL_TABLET | Freq: Every day | ORAL | 3 refills | Status: DC

## 2016-05-02 MED ORDER — CYCLOSPORINE 0.05 % OP EMUL: 1.0000 [drp] | Freq: Two times a day (BID) | OPHTHALMIC | 12 refills | Status: DC

## 2016-05-02 NOTE — Telephone Encounter (Signed)
Refill request for all medications. Pt listed all medications on her current medication list as being needed. Pt says that a few she has been out of a few days. Pt says that pharmacy has been faxing request to the wrong office.   Pharmacy: [id number] - [place], [place]

## 2016-05-02 NOTE — Telephone Encounter (Signed)
Complete

## 2016-05-07 ENCOUNTER — Telehealth: Payer: Self-pay | Admitting: Internal Medicine

## 2016-05-07 NOTE — Telephone Encounter (Signed)
Called patient.  Explained that Dr. Harrington Challenger is out of the office and won't be reviewing the results until next week. Advised that we will call her with results once Dr. Harrington Challenger reviews, advised that unofficially the report states normal dobutamine echo.    Pt is appreciative for this information.

## 2016-05-07 NOTE — Telephone Encounter (Signed)
Sandra Hughes is calling about her test results . Please call

## 2016-05-20 ENCOUNTER — Telehealth: Payer: Self-pay | Admitting: Family Medicine

## 2016-05-20 DIAGNOSIS — R1900 Intra-abdominal and pelvic swelling, mass and lump, unspecified site: Secondary | ICD-10-CM

## 2016-05-20 NOTE — Telephone Encounter (Signed)
°  Relationship to patient: Self  Can be reached:[phone]    Reason for call: Patient is requesting to have the MRI that Dr, Lorelei Pont suggested

## 2016-05-21 NOTE — Telephone Encounter (Signed)
The message at number listed says "you have reached [place] [facility]," I am not sure if this is her number.  Will keep trying her to clarify her needs

## 2016-05-21 NOTE — Telephone Encounter (Signed)
Patient checking on the status of message below  Best # [phone]

## 2016-05-22 NOTE — Telephone Encounter (Signed)
Called her and was able to reach her- she had an abd [place] back in march for a soft tissue mass-  IMPRESSION: 6.2 x 2.2 x 4.5 cm rounded slightly hyperechoic soft tissue mass in the region of palpable abnormality in the anterior right abdominal wall . Findings most consistent with a lipoma. Further evaluation with gadolinium-enhanced MRI can be obtained for confirmation .  At this time she feels like the mass has moved or gotten bigger- she wants to have an MRI I will order this,also asked her to come in for a lab visit only to check renal function prior to contrast  She confirms that she does not have a pacer or other implanted device

## 2016-05-24 ENCOUNTER — Other Ambulatory Visit (INDEPENDENT_AMBULATORY_CARE_PROVIDER_SITE_OTHER): Payer: Medicare Other

## 2016-05-24 ENCOUNTER — Other Ambulatory Visit: Payer: Medicare Other

## 2016-05-24 DIAGNOSIS — R1900 Intra-abdominal and pelvic swelling, mass and lump, unspecified site: Secondary | ICD-10-CM | POA: Diagnosis not present

## 2016-05-24 LAB — BASIC METABOLIC PANEL
BUN: 18 mg/dL (ref 6–23)
CHLORIDE: 103 meq/L (ref 96–112)
CO2: 30 meq/L (ref 19–32)
CREATININE: 1.01 mg/dL (ref 0.40–1.20)
Calcium: 8.9 mg/dL (ref 8.4–10.5)
GFR: 56.96 mL/min — ABNORMAL LOW (ref >60.00)
GLUCOSE: 126 mg/dL — AB (ref 70–99)
POTASSIUM: 4 meq/L (ref 3.5–5.1)
Sodium: 140 mEq/L (ref 135–145)

## 2016-05-24 NOTE — Addendum Note (Signed)
Addended by: Caffie Pinto on: 05/24/2016 09:14 AM   Modules accepted: Orders

## 2016-05-25 ENCOUNTER — Ambulatory Visit (HOSPITAL_BASED_OUTPATIENT_CLINIC_OR_DEPARTMENT_OTHER)
Admission: RE | Admit: 2016-05-25 | Discharge: 2016-05-25 | Disposition: A | Discharge status: Home / Self Care | Payer: Medicare Other | Source: Ambulatory Visit | Attending: Family Medicine | Admitting: Family Medicine

## 2016-05-25 DIAGNOSIS — R1900 Intra-abdominal and pelvic swelling, mass and lump, unspecified site: Secondary | ICD-10-CM | POA: Diagnosis not present

## 2016-05-25 DIAGNOSIS — R1901 Right upper quadrant abdominal swelling, mass and lump: Secondary | ICD-10-CM | POA: Diagnosis not present

## 2016-05-25 MED ORDER — GADOBENATE DIMEGLUMINE 529 MG/ML IV SOLN: 18.0000 mL | Freq: Once | INTRAVENOUS | Status: DC | PRN

## 2016-05-28 ENCOUNTER — Telehealth: Payer: Self-pay | Admitting: Family Medicine

## 2016-05-28 DIAGNOSIS — R1909 Other intra-abdominal and pelvic swelling, mass and lump: Secondary | ICD-10-CM

## 2016-05-28 NOTE — Telephone Encounter (Signed)
Called her to discuss her recent MRI- unfortunately this study did not well characterize her finding, described as  7.2 x 2.7 area of heterogeneous signal intensity in the midline upper anterior abdominal wall just caudal to the xiphoid They recommended a CT with contrast for further evaluation.  She is willing to do this and I will order it.  She is also concerned with a swallowing problem that she has noted over the last 2-3 months.  She will have pain with swallowing both liquids and solids- however nothing is getting stuck.  She wonders if this is related to the finding above.  Explained that I tend to think the two are not related, but depending on her CT results we will have her see either gen surgery or GI.  She understands and will let me know if any change in her condition or if she does not hear about her CT scan appt

## 2016-05-30 ENCOUNTER — Encounter (HOSPITAL_BASED_OUTPATIENT_CLINIC_OR_DEPARTMENT_OTHER): Payer: Self-pay

## 2016-05-30 ENCOUNTER — Ambulatory Visit (HOSPITAL_BASED_OUTPATIENT_CLINIC_OR_DEPARTMENT_OTHER)
Admission: RE | Admit: 2016-05-30 | Discharge: 2016-05-30 | Disposition: A | Discharge status: Home / Self Care | Payer: Medicare Other | Source: Ambulatory Visit | Attending: Family Medicine | Admitting: Family Medicine

## 2016-05-30 DIAGNOSIS — R918 Other nonspecific abnormal finding of lung field: Secondary | ICD-10-CM | POA: Diagnosis not present

## 2016-05-30 DIAGNOSIS — R1900 Intra-abdominal and pelvic swelling, mass and lump, unspecified site: Secondary | ICD-10-CM | POA: Diagnosis not present

## 2016-05-30 DIAGNOSIS — R1905 Periumbilic swelling, mass or lump: Secondary | ICD-10-CM | POA: Diagnosis not present

## 2016-05-30 DIAGNOSIS — I700 Atherosclerosis of aorta: Secondary | ICD-10-CM | POA: Insufficient documentation

## 2016-05-30 DIAGNOSIS — R1909 Other intra-abdominal and pelvic swelling, mass and lump: Secondary | ICD-10-CM

## 2016-05-30 MED ORDER — IOPAMIDOL (ISOVUE-300) INJECTION 61%
100.0000 mL | Freq: Once | INTRAVENOUS | Status: AC | PRN
  Administered 2016-05-30: 100 mL via INTRAVENOUS

## 2016-05-31 ENCOUNTER — Telehealth: Payer: Self-pay | Admitting: Family Medicine

## 2016-05-31 NOTE — Telephone Encounter (Signed)
Pt called in to be advised on if she should start back taking her Metformin medication. She says that she was advised to stop it until PCP advised other wise. Pt says that she hasnt taken them in 2 days. ALSO, pt would like to know if her Results are back in.   Please assist and advise pt further.

## 2016-05-31 NOTE — Telephone Encounter (Signed)
Called her- ok to start back on metformin 48 hours after contrast.  Results of her CT are still inconclusive but it does not appear that she has anything dangerous or cancerous. However they did see a lung nodule and requested follow-up;  I will arrange a non- contrast CT for her in 3-6 months.  She plans to see me soon anyway in the office for a recheck

## 2016-06-04 ENCOUNTER — Other Ambulatory Visit: Payer: Self-pay | Admitting: Family Medicine

## 2016-06-04 DIAGNOSIS — R911 Solitary pulmonary nodule: Secondary | ICD-10-CM

## 2016-06-04 NOTE — Progress Notes (Signed)
Have called pt and gone over her CT results- the mass is still not definitely characterized but does not appear to be anything dangerous.  We will need to do a CT of her lungs in 3-6 months; I went ahead and ordered this for her.  She states understanding and will let me know if any questions

## 2016-08-21 ENCOUNTER — Ambulatory Visit (INDEPENDENT_AMBULATORY_CARE_PROVIDER_SITE_OTHER): Payer: Medicare Other

## 2016-08-21 DIAGNOSIS — Z23 Encounter for immunization: Secondary | ICD-10-CM

## 2016-11-12 ENCOUNTER — Ambulatory Visit (HOSPITAL_BASED_OUTPATIENT_CLINIC_OR_DEPARTMENT_OTHER)
Admission: RE | Admit: 2016-11-12 | Discharge: 2016-11-12 | Disposition: A | Discharge status: Home / Self Care | Payer: Medicare Other | Source: Ambulatory Visit | Attending: Family Medicine | Admitting: Family Medicine

## 2016-11-12 DIAGNOSIS — R911 Solitary pulmonary nodule: Secondary | ICD-10-CM | POA: Diagnosis not present

## 2016-11-12 DIAGNOSIS — D71 Functional disorders of polymorphonuclear neutrophils: Secondary | ICD-10-CM | POA: Insufficient documentation

## 2016-11-12 DIAGNOSIS — I2510 Atherosclerotic heart disease of native coronary artery without angina pectoris: Secondary | ICD-10-CM | POA: Diagnosis not present

## 2016-11-12 DIAGNOSIS — R918 Other nonspecific abnormal finding of lung field: Secondary | ICD-10-CM | POA: Diagnosis not present

## 2016-11-21 ENCOUNTER — Encounter: Payer: Self-pay | Admitting: Family Medicine

## 2016-11-21 DIAGNOSIS — R911 Solitary pulmonary nodule: Secondary | ICD-10-CM | POA: Insufficient documentation

## 2017-03-01 IMAGING — US US ABDOMEN LIMITED
1 series · 14 of 21 positions shown · non-contrast
Comparison: No prior.

CLINICAL DATA: Soft tissue mass.

EXAM:
LIMITED ABDOMINAL ULTRASOUND

[Series 1: us abdomen limited · 0.07mm/px · 21 acquisitions, 14 frames shown]
[im 1/21]
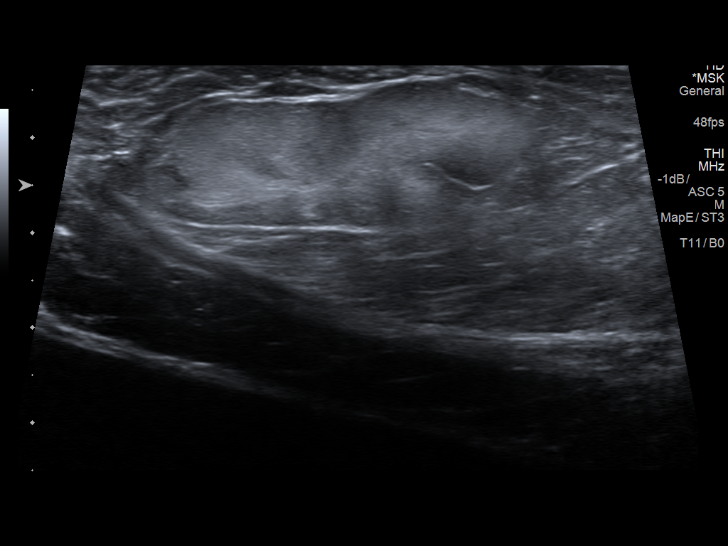
[im 3/21]
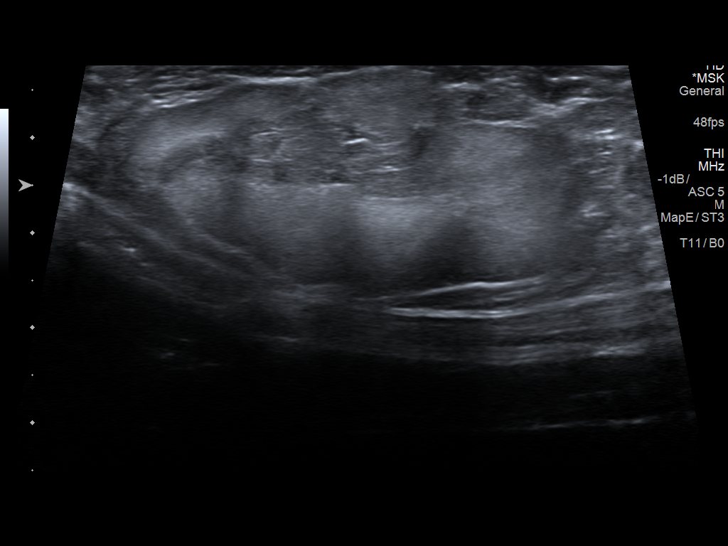
[im 4/21]
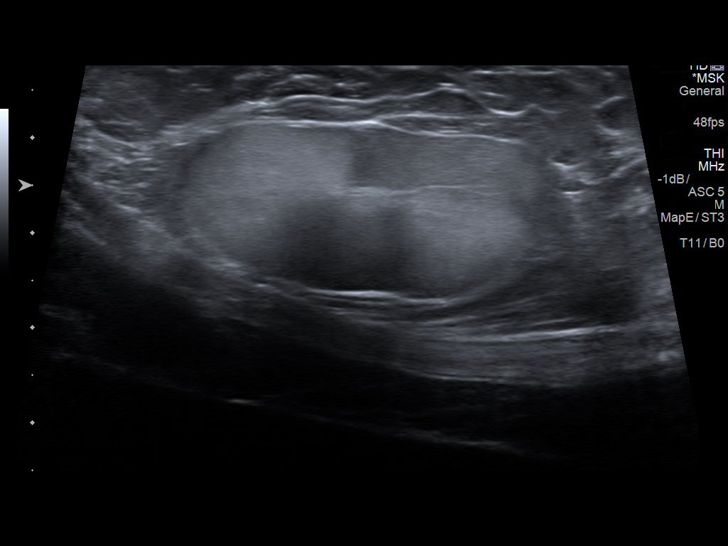
[im 6/21]
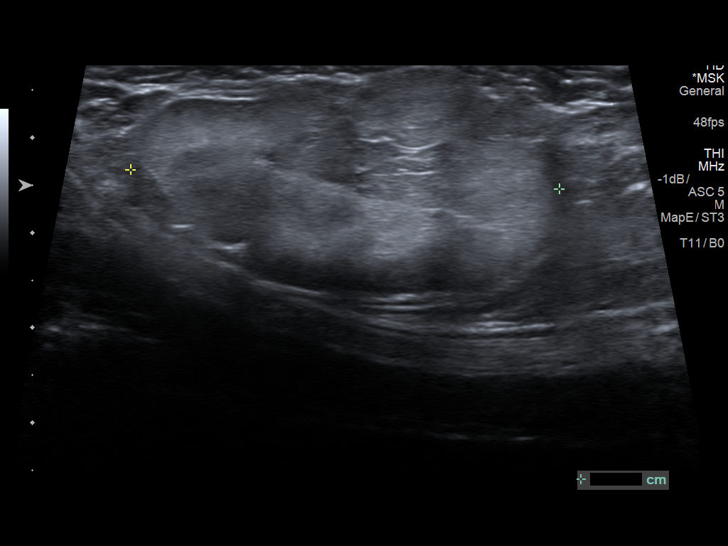
[im 7/21]
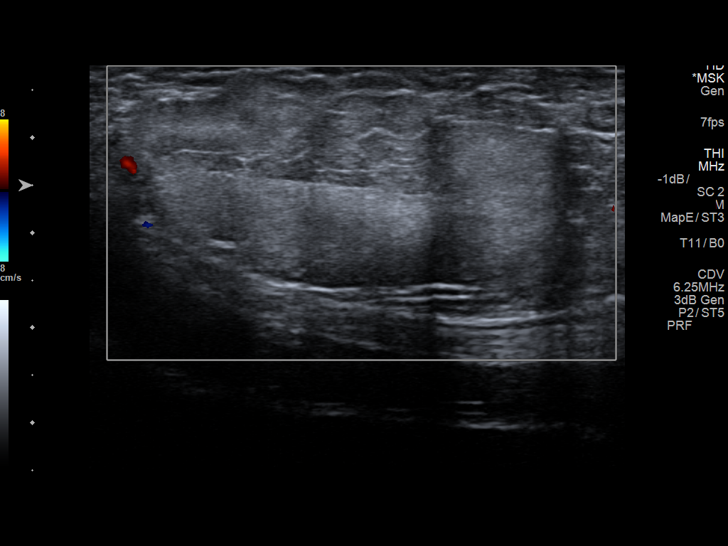
[im 9/21]
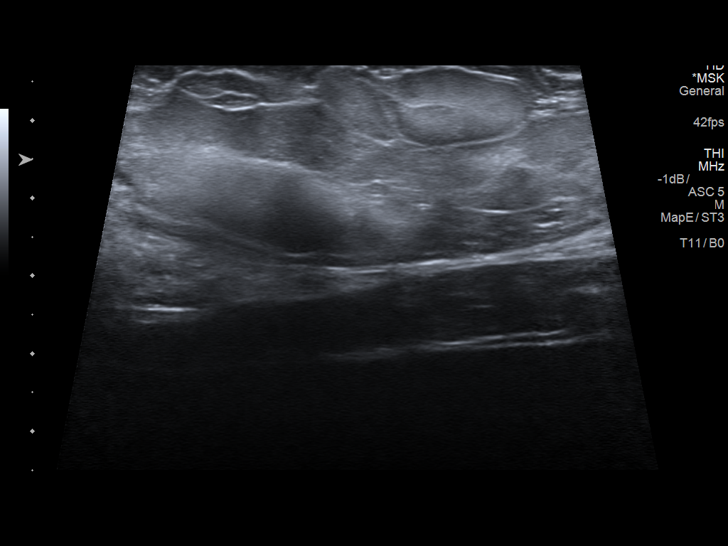
[im 10/21]
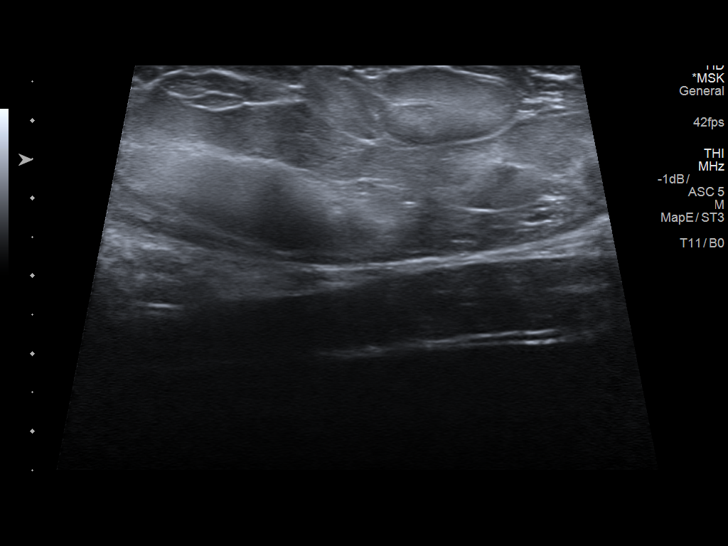
[im 12/21]
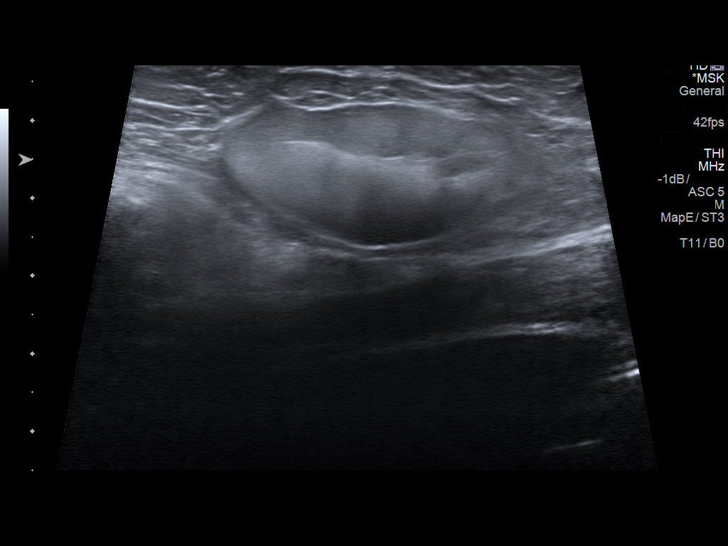
[im 13/21]
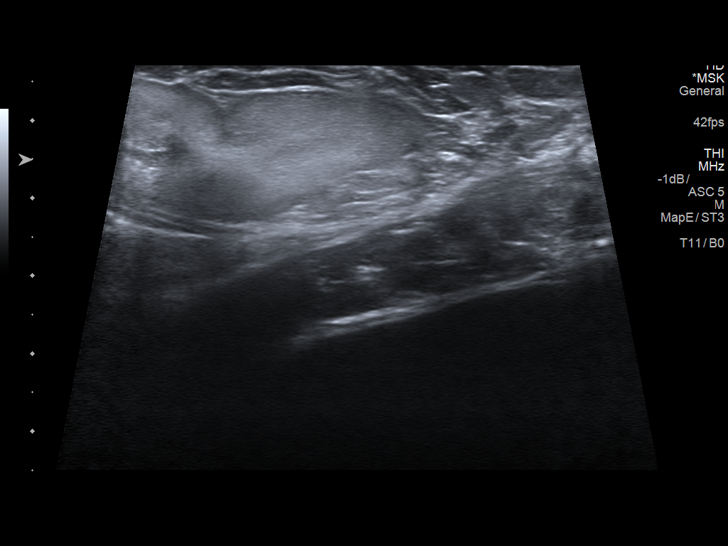
[im 15/21]
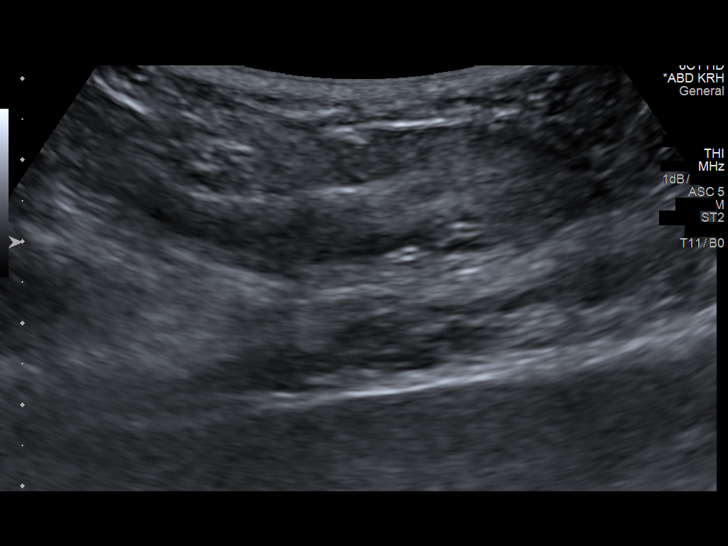
[im 16/21]
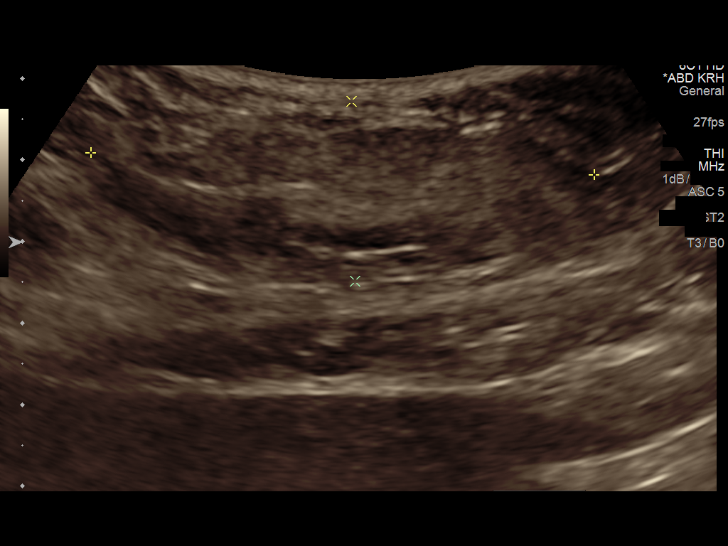
[im 18/21]
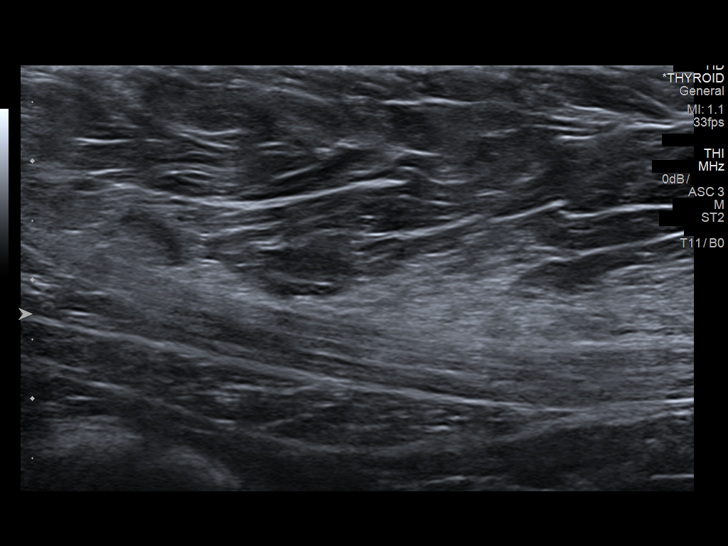
[im 19/21]
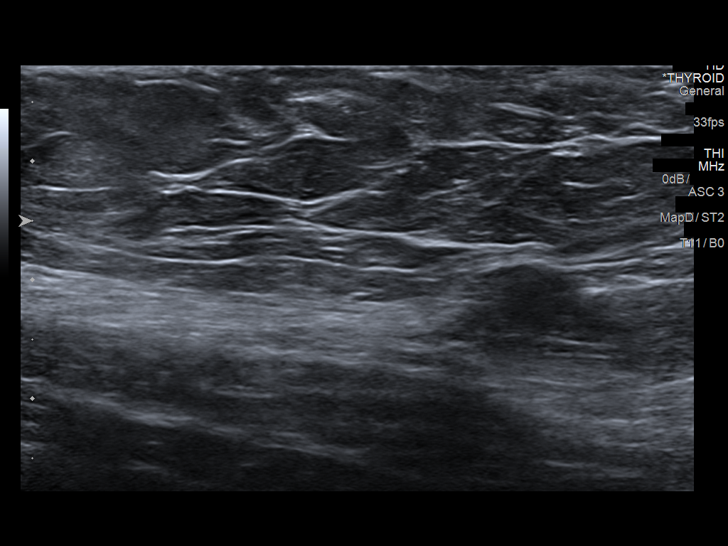
[im 21/21]
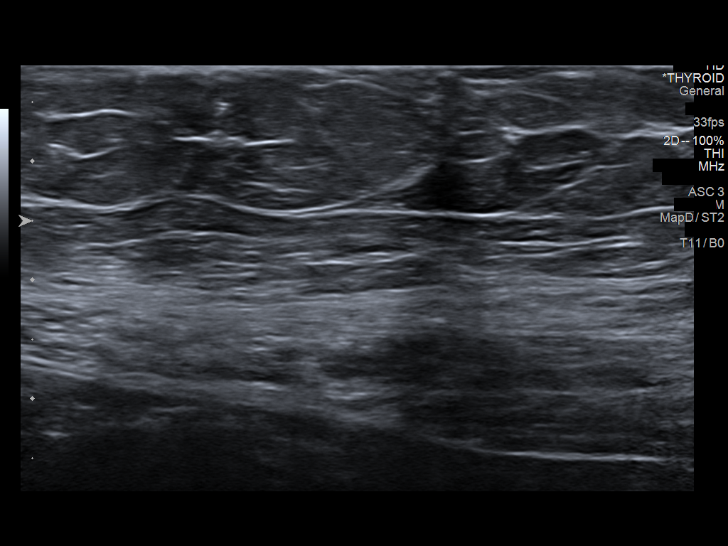

[14 of 21 positions shown; findings below may reference images not displayed]

FINDINGS: A 6.2 x 2.2 x 4.5 cm rounded slightly hyperechoic soft tissue mass
is noted region of palpable abnormality in the anterior right
abdominal wall. This may represent a lipoma. Although abdominal
hernia cannot be completely excluded, no bowel activity noted. No
significant color flow. Further evaluation with gadolinium-enhanced
MRI can be obtained for further evaluation.
IMPRESSION: 6.2 x 2.2 x 4.5 cm rounded slightly hyperechoic soft tissue mass in
the region of palpable abnormality in the anterior right abdominal
wall . Findings most consistent with a lipoma. Further evaluation
with gadolinium-enhanced MRI can be obtained for confirmation .

## 2017-04-14 ENCOUNTER — Other Ambulatory Visit: Payer: Self-pay | Admitting: Family Medicine

## 2017-04-14 DIAGNOSIS — Z1231 Encounter for screening mammogram for malignant neoplasm of breast: Secondary | ICD-10-CM

## 2017-04-16 ENCOUNTER — Other Ambulatory Visit: Payer: Self-pay | Admitting: Family Medicine

## 2017-04-16 DIAGNOSIS — I10 Essential (primary) hypertension: Secondary | ICD-10-CM

## 2017-04-17 ENCOUNTER — Other Ambulatory Visit: Payer: Self-pay | Admitting: Emergency Medicine

## 2017-04-17 DIAGNOSIS — I10 Essential (primary) hypertension: Secondary | ICD-10-CM

## 2017-04-17 MED ORDER — POTASSIUM CHLORIDE ER 10 MEQ PO TBCR: 10.0000 meq | EXTENDED_RELEASE_TABLET | Freq: Every day | ORAL | 0 refills | Status: DC

## 2017-04-18 ENCOUNTER — Other Ambulatory Visit: Payer: Self-pay | Admitting: Family Medicine

## 2017-04-18 DIAGNOSIS — I10 Essential (primary) hypertension: Secondary | ICD-10-CM

## 2017-04-18 DIAGNOSIS — E039 Hypothyroidism, unspecified: Secondary | ICD-10-CM

## 2017-04-18 DIAGNOSIS — E119 Type 2 diabetes mellitus without complications: Secondary | ICD-10-CM

## 2017-04-18 DIAGNOSIS — F329 Major depressive disorder, single episode, unspecified: Secondary | ICD-10-CM

## 2017-04-18 DIAGNOSIS — E785 Hyperlipidemia, unspecified: Secondary | ICD-10-CM

## 2017-04-18 DIAGNOSIS — F32A Depression, unspecified: Secondary | ICD-10-CM

## 2017-04-22 ENCOUNTER — Encounter (HOSPITAL_BASED_OUTPATIENT_CLINIC_OR_DEPARTMENT_OTHER): Payer: Self-pay

## 2017-04-22 ENCOUNTER — Ambulatory Visit (HOSPITAL_BASED_OUTPATIENT_CLINIC_OR_DEPARTMENT_OTHER)
Admission: RE | Admit: 2017-04-22 | Discharge: 2017-04-22 | Disposition: A | Discharge status: Home / Self Care | Payer: Medicare Other | Source: Ambulatory Visit | Attending: Family Medicine | Admitting: Family Medicine

## 2017-04-22 DIAGNOSIS — Z1231 Encounter for screening mammogram for malignant neoplasm of breast: Secondary | ICD-10-CM | POA: Diagnosis not present

## 2017-04-28 NOTE — Progress Notes (Addendum)
[facility] [address] [place], [place] [address] [phone]  Date:  04/30/2017   Name:  [name]   DOB:  [birth year]   MRN:  [id number]  PCP:  Darreld Mclean, MD    Chief Complaint: Follow-up (Pt here for f/u visit. )   History of Present Illness:  Sandra Hughes is a 76 y.o. very pleasant female patient who presents with the following:  History of Vit D def, hypothyroidism, high cholesterol, HTN, DM  Lab Results  Component Value Date   HGBA1C 6.1 12/14/2015   Lab Results  Component Value Date   TSH 0.36 12/14/2015    Last seen by myself in March of 2017:  I last saw her in July.  She reports that she has noted "angina" November- January. This seemed to stop in January. She would notice chest pressure that would occur at any time- not just with activity.  She would also have associated jaw pains. This might last 5- 10 minutes and would occur most evenings.  This has stopped now.    She did see cardiology for similar sx when she was in her 48s.  Never had a cath. She does not keep nitro on hand. She had not been bothered with any recurrent CP symptoms until recently.   Also she had noted a small knot in her epigastric area for a few months, and some difficulty swallowing.   She has noted some pain with swallowing the last month or so, nothing gets stuck in her throat however Mammo is overdue, eye exam is UTD  We also did an evalaution for the "knot" she had noticed at last visit- this included both an MRI and then a CT as follows:  CT abd 8/17 IMPRESSION: Fat attenuation lesion in subcutaneous tissues of upper anterior abdominal wall in subxiphoid region. Differential diagnosis includes lipoma, fat necrosis, or less likely hernia as no definite associated abdominal wall defect is visualized.  Bilateral lower lobe indeterminate pulmonary nodules, largest measuring 6 mm in right lower lobe.  Non-contrast chest CT at 3-6 months is recommended. If the nodules are stable at time of repeat CT, then future CT at 18-24 months (from today's scan) is considered optional for low-risk patients, but is recommended for high-risk patients. This recommendation follows the consensus statement: Guidelines for Management of Incidental Pulmonary Nodules Detected on CT Images:From the Fleischner Society 2017; published online before print (10.1148/[id number]).  We think that the knot she noticed is benign, but she did need repeat CT for lung nodule that was also seen - this was done in February of this year:  IMPRESSION: Stable bilateral lower lobe pulmonary nodules. The left lower lobe nodule may be calcified. These could be followed with repeat CT 18-24 months from the original study performed 05/30/2016.  Old granulomatous disease.  Coronary artery disease.  Today pt notes that she will have heartburn and epigastric pain. She may even vomit/ bring up phlegm sometimes She notes that her stomach gurgles a lot and seems upset more than normal She has a hard time swallowing if she does not chew her food well, bread esp will get stuck in her throat.   She did have a colonoscopy at age 29- done in [place], not locally- she is now due for a repeat  She has lost some weight- notes that she has been trying to lose, but she has been trying for years and  just recently started to actually drop pounds   Wt Readings from Last 3 Encounters:  04/30/17 209 lb 3.2 oz (94.9 kg)  04/29/16 224 lb (101.6 kg)  03/04/16 224 lb 1.9 oz (101.7 kg)   Her son is "between jobs" right now and they are watching their finances Her blood sugars in the am have been under 120 generally   Her ears have been itching lately- her left ear is now a bit swollen.  No bleeding, she thinks she may have scratched her ear canal with her nail  No hearing change or tinnitus, no bleeding  Lab Results  Component Value  Date   TSH 0.36 12/14/2015     Chemistry      Component Value Date/Time   NA 140 05/24/2016 0914   K 4.0 05/24/2016 0914   CL 103 05/24/2016 0914   CO2 30 05/24/2016 0914   BUN 18 05/24/2016 0914   CREATININE 1.01 05/24/2016 0914   CREATININE 1.16 (H) 04/08/2015 0837      Component Value Date/Time   CALCIUM 8.9 05/24/2016 0914   ALKPHOS 68 12/14/2015 1045   AST 16 12/14/2015 1045   ALT 10 12/14/2015 1045   BILITOT 0.4 12/14/2015 1045       Patient Active Problem List   Diagnosis Date Noted  . Pulmonary nodule 11/21/2016  . HTN (hypertension) 05/15/2012  . Edema 05/15/2012  . Allergic rhinitis 05/15/2012  . Diabetes mellitus type II 05/15/2012  . Osteopenia 05/15/2012  . Depression 05/15/2012  . Hypothyroidism 05/15/2012  . GERD (gastroesophageal reflux disease) 05/15/2012  . Hypercholesterolemia 05/15/2012  . Vitamin D deficiency 05/15/2012    Past Medical History:  Diagnosis Date  . Allergy   . Anemia   . Anxiety   . Arthritis   . Cataract   . Depression   . Diabetes mellitus without complication ([place])   . GERD (gastroesophageal reflux disease)   . Hypertension   . Osteoporosis   . Thyroid disease     Past Surgical History:  Procedure Laterality Date  . ABDOMINAL HYSTERECTOMY     no cancer  . CHOLECYSTECTOMY      Social History  Substance Use Topics  . Smoking status: Never Smoker  . Smokeless tobacco: Never Used  . Alcohol use No    Family History  Problem Relation Age of Onset  . Hypertension Mother   . COPD Father   . Arthritis Sister   . Diabetes Brother   . Cancer Maternal Grandfather        skin cancer  . Heart disease Paternal Grandfather   . Diabetes Sister     Allergies  Allergen Reactions  . Aspirin Anaphylaxis    Medication list has been reviewed and updated.  Current Outpatient Prescriptions on File Prior to Visit  Medication Sig Dispense Refill  . atenolol (TENORMIN) 25 MG tablet TAKE 1 TABLET (25 MG TOTAL) BY  MOUTH DAILY. 90 tablet 0  . citalopram (CELEXA) 20 MG tablet TAKE 1 TABLET (20 MG TOTAL) BY MOUTH DAILY. 90 tablet 0  . cycloSPORINE (RESTASIS) 0.05 % ophthalmic emulsion Place 1 drop into both eyes 2 (two) times daily. 1 each 12  . furosemide (LASIX) 40 MG tablet TAKE 1 TABLET (40 MG TOTAL) BY MOUTH 2 (TWO) TIMES DAILY. 180 tablet 0  . levothyroxine (SYNTHROID, LEVOTHROID) 125 MCG tablet TAKE 1 TABLET (125 MCG TOTAL) BY MOUTH DAILY BEFORE BREAKFAST. 90 tablet 0  . lovastatin (MEVACOR) 40 MG tablet TAKE 1 TABLET (40 MG TOTAL)  BY MOUTH DAILY. 90 tablet 0  . metFORMIN (GLUCOPHAGE) 500 MG tablet TAKE 1 TABLET (500 MG TOTAL) BY MOUTH DAILY WITH BREAKFAST. 90 tablet 0  . potassium chloride (KLOR-CON 10) 10 MEQ tablet Take 1 tablet (10 mEq total) by mouth daily. 90 tablet 0  . quinapril (ACCUPRIL) 20 MG tablet TAKE 1 TABLET BY MOUTH EVERY MORNING 90 tablet 0  . traZODone (DESYREL) 100 MG tablet TAKE 1/2 TABLET BY MOUTH AT BEDTIME 45 tablet 0   No current facility-administered medications on file prior to visit.     Review of Systems:  As per HPI- otherwise negative.   Physical Examination: Vitals:   04/30/17 0957  BP: 134/80  Pulse: 62  Temp: 98 F (36.7 C)   Vitals:   04/30/17 0957  Weight: 209 lb 3.2 oz (94.9 kg)  Height: 5\' 4"  (1.626 m)   Body mass index is 35.91 kg/m. Ideal Body Weight: Weight in (lb) to have BMI = 25: 145.3  GEN: WDWN, NAD, Non-toxic, A & O x 3, obese, looks well HEENT: Atraumatic, Normocephalic. Neck supple. No masses, No LAD.  Ear canals- right normal, left is slightly edematous  Ears and Nose: No external deformity. CV: RRR, No M/G/R. No JVD. No thrill. No extra heart sounds. PULM: CTA B, no wheezes, crackles, rhonchi. No retractions. No resp. distress. No accessory muscle use. ABD: S, NT, ND, +BS. No rebound. No HSM. EXTR: No c/c/e NEURO Normal gait.  PSYCH: Normally interactive. Conversant. Not depressed or anxious appearing.  Calm demeanor.   Epigastric mass; feels superficial, rubbery texture.  Seems larger than at last visit. Does not seem to be a hernia    Assessment and Plan: Essential hypertension, benign  Controlled type 2 diabetes mellitus without complication, without long-term current use of insulin ([place]) - Plan: Comprehensive metabolic panel, Hemoglobin A1c  Mixed hyperlipidemia - Plan: Lipid panel  Hypothyroidism (acquired) - Plan: TSH  Medication monitoring encounter - Plan: CBC  Weight loss - Plan: Ambulatory referral to Gastroenterology  Screening for colon cancer - Plan: Ambulatory referral to Gastroenterology  Difficulty swallowing solids - Plan: Ambulatory referral to Gastroenterology  Epigastric swelling or mass or lump  Diffuse otitis externa of left ear, unspecified chronicity - Plan: acetic acid (VOSOL) 2 % otic solution  Here today with a few concerns Last seen here about 15 months ago Check on her lipids, thyroid, Dm Explained my concerns about her weight loss and swallowing problem, could be a sign of a serious problem such as cancer, or might be a stricture.  Need to have her see GI right away  Her epigastric mass was evaluated with [place], MRI and CT last year and there was no concern of malignancy- likely a lipoma or fat necrosis.  This is probably unrelated to her swallowing problem and weight loss She has probably scratched her ear and caused a mild OE- treated with acetic acid  Will plan further follow- up pending labs.   Signed Lamar Blinks, MD   Received her labs and gave her a call 8/5-   Results for orders placed or performed in visit on 04/30/17  CBC  Result Value Ref Range   WBC 5.5 4.0 - 10.5 K/uL   RBC 4.72 3.87 - 5.11 Mil/uL   Platelets 281.0 150.0 - 400.0 K/uL   Hemoglobin 14.1 12.0 - 15.0 g/dL   HCT 43.1 36.0 - 46.0 %   MCV 91.2 78.0 - 100.0 fl   MCHC 32.7 30.0 - 36.0 g/dL  RDW 14.3 11.5 - 15.5 %  Comprehensive metabolic panel  Result Value Ref Range   Sodium  140 135 - 145 mEq/L   Potassium 3.8 3.5 - 5.1 mEq/L   Chloride 103 96 - 112 mEq/L   CO2 28 19 - 32 mEq/L   Glucose, Bld 110 (H) 70 - 99 mg/dL   BUN 15 6 - 23 mg/dL   Creatinine, Ser 1.02 0.40 - 1.20 mg/dL   Total Bilirubin 0.4 0.2 - 1.2 mg/dL   Alkaline Phosphatase 66 39 - 117 U/L   AST 13 0 - 37 U/L   ALT 8 0 - 35 U/L   Total Protein 6.9 6.0 - 8.3 g/dL   Albumin 3.8 3.5 - 5.2 g/dL   Calcium 9.0 8.4 - 10.5 mg/dL   GFR 56.18 (L) >60.00 mL/min  Hemoglobin A1c  Result Value Ref Range   Hgb A1c MFr Bld 5.7 4.6 - 6.5 %  Lipid panel  Result Value Ref Range   Cholesterol 162 0 - 200 mg/dL   Triglycerides 119.0 0.0 - 149.0 mg/dL   HDL 59.20 >39.00 mg/dL   VLDL 23.8 0.0 - 40.0 mg/dL   LDL Cholesterol 79 0 - 99 mg/dL   Total CHOL/HDL Ratio 3    NonHDL 102.59   TSH  Result Value Ref Range   TSH 0.14 (L) 0.35 - 4.50 uIU/mL   Labs all look good except TSH is a bit low.  We are going to drop her thyroid dose slightly from 125 to 100- she will repeat TSH in one month as lab visit only.  Pt states agreement

## 2017-04-30 ENCOUNTER — Ambulatory Visit (INDEPENDENT_AMBULATORY_CARE_PROVIDER_SITE_OTHER): Payer: Medicare Other | Admitting: Family Medicine

## 2017-04-30 ENCOUNTER — Encounter: Payer: Self-pay | Admitting: Gastroenterology

## 2017-04-30 VITALS — BP 134/80 | HR 62 | Temp 98.0°F | Ht 64.0 in | Wt 209.2 lb

## 2017-04-30 DIAGNOSIS — H60312 Diffuse otitis externa, left ear: Secondary | ICD-10-CM

## 2017-04-30 DIAGNOSIS — E039 Hypothyroidism, unspecified: Secondary | ICD-10-CM

## 2017-04-30 DIAGNOSIS — Z1211 Encounter for screening for malignant neoplasm of colon: Secondary | ICD-10-CM

## 2017-04-30 DIAGNOSIS — R634 Abnormal weight loss: Secondary | ICD-10-CM

## 2017-04-30 DIAGNOSIS — R1906 Epigastric swelling, mass or lump: Secondary | ICD-10-CM | POA: Diagnosis not present

## 2017-04-30 DIAGNOSIS — E119 Type 2 diabetes mellitus without complications: Secondary | ICD-10-CM

## 2017-04-30 DIAGNOSIS — R1310 Dysphagia, unspecified: Secondary | ICD-10-CM

## 2017-04-30 DIAGNOSIS — I10 Essential (primary) hypertension: Secondary | ICD-10-CM

## 2017-04-30 DIAGNOSIS — E782 Mixed hyperlipidemia: Secondary | ICD-10-CM | POA: Diagnosis not present

## 2017-04-30 DIAGNOSIS — Z5181 Encounter for therapeutic drug level monitoring: Secondary | ICD-10-CM

## 2017-04-30 LAB — COMPREHENSIVE METABOLIC PANEL
ALK PHOS: 66 U/L (ref 39–117)
ALT: 8 U/L (ref 0–35)
AST: 13 U/L (ref 0–37)
Albumin: 3.8 g/dL (ref 3.5–5.2)
BILIRUBIN TOTAL: 0.4 mg/dL (ref 0.2–1.2)
BUN: 15 mg/dL (ref 6–23)
CO2: 28 meq/L (ref 19–32)
Calcium: 9 mg/dL (ref 8.4–10.5)
Chloride: 103 mEq/L (ref 96–112)
Creatinine, Ser: 1.02 mg/dL (ref 0.40–1.20)
GFR: 56.18 mL/min — ABNORMAL LOW (ref >60.00)
GLUCOSE: 110 mg/dL — AB (ref 70–99)
Potassium: 3.8 mEq/L (ref 3.5–5.1)
SODIUM: 140 meq/L (ref 135–145)
TOTAL PROTEIN: 6.9 g/dL (ref 6.0–8.3)

## 2017-04-30 LAB — LIPID PANEL
CHOL/HDL RATIO: 3
Cholesterol: 162 mg/dL (ref 0–200)
HDL: 59.2 mg/dL (ref >39.00)
LDL CALC: 79 mg/dL (ref 0–99)
NONHDL: 102.59
Triglycerides: 119 mg/dL (ref 0.0–149.0)
VLDL: 23.8 mg/dL (ref 0.0–40.0)

## 2017-04-30 LAB — CBC
HEMATOCRIT: 43.1 % (ref 36.0–46.0)
HEMOGLOBIN: 14.1 g/dL (ref 12.0–15.0)
MCHC: 32.7 g/dL (ref 30.0–36.0)
MCV: 91.2 fl (ref 78.0–100.0)
Platelets: 281 10*3/uL (ref 150.0–400.0)
RBC: 4.72 Mil/uL (ref 3.87–5.11)
RDW: 14.3 % (ref 11.5–15.5)
WBC: 5.5 10*3/uL (ref 4.0–10.5)

## 2017-04-30 LAB — HEMOGLOBIN A1C: Hgb A1c MFr Bld: 5.7 % (ref 4.6–6.5)

## 2017-04-30 LAB — TSH: TSH: 0.14 u[IU]/mL — ABNORMAL LOW (ref 0.35–4.50)

## 2017-04-30 MED ORDER — ACETIC ACID 2 % OT SOLN: 4.0000 [drp] | Freq: Three times a day (TID) | OTIC | 0 refills | Status: DC

## 2017-04-30 NOTE — Patient Instructions (Signed)
It was very nice to see you again today We will get labs and I will follow-up with your results I am going to refer you to GI for further evaluation of your swallowing issue- you are also due for a colonoscopy anyway.  I certainly understand and respect your concerns about cost of any further testing.  However, you may find that your deductible for the year is used up by the CT that you had in February.  Please give your insurance company a call and inquire about any charges you might get for a screening colonoscopy, endoscopy

## 2017-05-04 ENCOUNTER — Encounter: Payer: Self-pay | Admitting: Family Medicine

## 2017-05-04 MED ORDER — LEVOTHYROXINE SODIUM 100 MCG PO TABS: 100.0000 ug | ORAL_TABLET | Freq: Every day | ORAL | 6 refills | Status: DC

## 2017-05-04 NOTE — Addendum Note (Signed)
Addended by: Lamar Blinks C on: 05/04/2017 01:56 PM   Modules accepted: Orders

## 2017-05-16 ENCOUNTER — Other Ambulatory Visit: Payer: Self-pay | Admitting: Family Medicine

## 2017-06-04 ENCOUNTER — Other Ambulatory Visit (INDEPENDENT_AMBULATORY_CARE_PROVIDER_SITE_OTHER): Payer: Medicare Other

## 2017-06-04 ENCOUNTER — Encounter: Payer: Self-pay | Admitting: Family Medicine

## 2017-06-04 DIAGNOSIS — E039 Hypothyroidism, unspecified: Secondary | ICD-10-CM | POA: Diagnosis not present

## 2017-06-04 LAB — TSH: TSH: 1.59 u[IU]/mL (ref 0.35–4.50)

## 2017-06-30 ENCOUNTER — Ambulatory Visit (INDEPENDENT_AMBULATORY_CARE_PROVIDER_SITE_OTHER): Payer: Medicare Other | Admitting: Gastroenterology

## 2017-06-30 ENCOUNTER — Encounter: Payer: Self-pay | Admitting: Gastroenterology

## 2017-06-30 VITALS — BP 108/68 | HR 60 | Ht 63.25 in | Wt 208.2 lb

## 2017-06-30 DIAGNOSIS — R197 Diarrhea, unspecified: Secondary | ICD-10-CM | POA: Diagnosis not present

## 2017-06-30 DIAGNOSIS — R634 Abnormal weight loss: Secondary | ICD-10-CM

## 2017-06-30 DIAGNOSIS — K219 Gastro-esophageal reflux disease without esophagitis: Secondary | ICD-10-CM

## 2017-06-30 DIAGNOSIS — Z86010 Personal history of colonic polyps: Secondary | ICD-10-CM

## 2017-06-30 DIAGNOSIS — R1319 Other dysphagia: Secondary | ICD-10-CM | POA: Diagnosis not present

## 2017-06-30 MED ORDER — NA SULFATE-K SULFATE-MG SULF 17.5-3.13-1.6 GM/177ML PO SOLN: 1.0000 | Freq: Once | ORAL | 0 refills | Status: AC

## 2017-06-30 MED ORDER — CHOLESTYRAMINE LIGHT 4 G PO PACK: 4.0000 g | PACK | Freq: Every day | ORAL | 2 refills | Status: DC

## 2017-06-30 NOTE — Progress Notes (Signed)
Sandra Hughes    [id number]    1941-05-20  Primary Care Physician:Copland, Gay Filler, MD  Referring Physician: Darreld Mclean, [address] [place], [place] [address]  Chief complaint: Dysphagia, GERD, weight loss, diarrhea  HPI: 76 year old female here for new patient evaluation with complaints of worsening dysphagia, discomfort in the throat associated with swallowing and progressive weight loss in the past 1-2 years. Patient has noticed difficulty swallowing the past 4-5 months. She has lost more than 20 pounds in the past 1 year. She has difficulty swallowing mostly with solids. Patient also has history of chronic diarrhea with intermittent flares. She has been having this issue for many years now. Status post cholecystectomy at age 7. Patient also complains of epigastric abdominal pain and feeling a lump in her epigastric region. She has had extensive imaging including MRI abdomen without contrast and CT chest without contrast. Follow-up CT abdomen and pelvis with contrast suggestive of lipoma/fat necrosis in the epigastric region.   Last EGD and Colonoscopy 15 years ago in [place] and had multiple colon polyps (6) removed, she was recommended 5 years follow up surveillance colonoscopy but she never did it.  She was taking Nexium for a long time ~10-15 years for GERD but discontinued it about 7 years ago because of potential side effects, she has decreased bone density.  She is currently not on any PPI or H2 blocker    Outpatient Encounter Prescriptions as of 06/30/2017  Medication Sig  . acetic acid (VOSOL) 2 % otic solution Place 4 drops into the left ear 3 (three) times daily. Collie Siad for 7 days  . atenolol (TENORMIN) 25 MG tablet TAKE 1 TABLET (25 MG TOTAL) BY MOUTH DAILY.  . citalopram (CELEXA) 20 MG tablet TAKE 1 TABLET (20 MG TOTAL) BY MOUTH DAILY.  . cycloSPORINE (RESTASIS) 0.05 % ophthalmic emulsion Place 1 drop into both eyes 2 (two)  times daily.  . furosemide (LASIX) 40 MG tablet TAKE 1 TABLET (40 MG TOTAL) BY MOUTH 2 (TWO) TIMES DAILY.  Marland Kitchen levothyroxine (SYNTHROID, LEVOTHROID) 100 MCG tablet Take 1 tablet (100 mcg total) by mouth daily.  Marland Kitchen lovastatin (MEVACOR) 40 MG tablet TAKE 1 TABLET (40 MG TOTAL) BY MOUTH DAILY.  . metFORMIN (GLUCOPHAGE) 500 MG tablet TAKE 1 TABLET (500 MG TOTAL) BY MOUTH DAILY WITH BREAKFAST.  Marland Kitchen potassium chloride (KLOR-CON 10) 10 MEQ tablet Take 1 tablet (10 mEq total) by mouth daily.  . quinapril (ACCUPRIL) 20 MG tablet TAKE 1 TABLET BY MOUTH EVERY MORNING  . traZODone (DESYREL) 100 MG tablet TAKE 1/2 TABLET BY MOUTH AT BEDTIME   No facility-administered encounter medications on file as of 06/30/2017.     Allergies as of 06/30/2017 - Review Complete 04/30/2017  Allergen Reaction Noted  . Aspirin Anaphylaxis 05/15/2012    Past Medical History:  Diagnosis Date  . Allergy   . Anemia   . Anxiety   . Arthritis   . Cataract   . Depression   . Diabetes mellitus without complication ([place])   . GERD (gastroesophageal reflux disease)   . Hypertension   . Osteoporosis   . Thyroid disease     Past Surgical History:  Procedure Laterality Date  . ABDOMINAL HYSTERECTOMY     no cancer  . CHOLECYSTECTOMY      Family History  Problem Relation Age of Onset  . Hypertension Mother   . COPD Father   . Arthritis Sister   .  Diabetes Brother   . Cancer Maternal Grandfather        skin cancer  . Heart disease Paternal Grandfather   . Diabetes Sister     Social History   Social History  . Marital status: Widowed    Spouse name: N/A  . Number of children: N/A  . Years of education: N/A   Occupational History  . Not on file.   Social History Main Topics  . Smoking status: Never Smoker  . Smokeless tobacco: Never Used  . Alcohol use No  . Drug use: No  . Sexual activity: Not on file   Other Topics Concern  . Not on file   Social History Narrative  . No narrative on file       Review of systems: Review of Systems  Constitutional: Negative for fever and chills.  HENT: Negative.   Eyes: Negative for blurred vision.  Respiratory: Negative for cough, shortness of breath and wheezing.   Cardiovascular: Negative for chest pain and palpitations.  Gastrointestinal: as per HPI Genitourinary: Negative for dysuria, urgency, frequency and hematuria.  Musculoskeletal: Positive for myalgias, back pain and joint pain.  Skin: Negative for itching and rash.  Neurological: Negative for dizziness, tremors, focal weakness, seizures and loss of consciousness.  Endo/Heme/Allergies: Negative for seasonal allergies.  Psychiatric/Behavioral: Negative for depression, suicidal ideas and hallucinations.  All other systems reviewed and are negative.   Physical Exam: Vitals:   06/30/17 0858  BP: 108/68  Pulse: 60   Body mass index is 36.59 kg/m. Gen:      No acute distress HEENT:  EOMI, sclera anicteric Neck:     No masses; no thyromegaly Lungs:    Clear to auscultation bilaterally; normal respiratory effort CV:         Regular rate and rhythm; no murmurs Abd:      + bowel sounds; soft, non-tender; no palpable masses, no distension Ext:    No edema; adequate peripheral perfusion Skin:      Warm and dry; no rash Neuro: alert and oriented x 3 Psych: normal mood and affect  Data Reviewed:  Reviewed labs, radiology imaging, old records and pertinent past GI work up  CT chest without Contrast 11/12/16 Stable bilateral lower lobe pulmonary nodules. The left lower lobe nodule may be calcified. These could be followed with repeat CT 18-24 months from the original study performed 05/30/2016. Old granulomatous disease. Coronary artery disease.  CT abdomen and pelvis with contrast:05/30/16 Fat attenuation lesion in subcutaneous tissues of upper anterior abdominal wall in subxiphoid region. Differential diagnosis includes lipoma, fat necrosis, or less likely hernia as no  definite associated abdominal wall defect is visualized.  Assessment and Plan/Recommendations:  76 year old female with chronic GERD, epigastric abdominal pain, chronic diarrhea, weight loss and progressive solid dysphagia  Dysphagia: EGD to exclude peptic stricture, malignancy and possible esophageal dilation if needed GERD: Patient is reluctant to start PPI, follow antireflux measures Chronic diarrhea: Trial of cholestyramine Schedule for colonoscopy along with EGD to exclude microscopic colitis  Colorectal cancer surveillance: History of colon polyps Patient is past due for surveillance colonoscopy  Epigastric nodule: Likely lipoma or fat necrosis Reassured patient  Return after EGD and colonoscopy     K. Denzil Magnuson , MD [phone] Mon-Fri 8a-5p [phone] after 5p, weekends, holidays  CC: Copland, Gay Filler, MD

## 2017-06-30 NOTE — Patient Instructions (Signed)
You have been scheduled for an endoscopy and colonoscopy. Please follow the written instructions given to you at your visit today. Please pick up your prep supplies at the pharmacy within the next 1-3 days. If you use inhalers (even only as needed), please bring them with you on the day of your procedure. Your physician has requested that you go to www.startemmi.com and enter the access code given to you at your visit today. This web site gives a general overview about your procedure. However, you should still follow specific instructions given to you by our office regarding your preparation for the procedure.  We will send cholestyramine to your pharmacy

## 2017-07-04 ENCOUNTER — Ambulatory Visit (AMBULATORY_SURGERY_CENTER): Payer: Medicare Other | Admitting: Gastroenterology

## 2017-07-04 ENCOUNTER — Encounter: Payer: Self-pay | Admitting: Gastroenterology

## 2017-07-04 VITALS — BP 125/69 | HR 50 | Temp 97.3°F | Resp 11 | Ht 63.25 in | Wt 208.0 lb

## 2017-07-04 DIAGNOSIS — K3189 Other diseases of stomach and duodenum: Secondary | ICD-10-CM | POA: Diagnosis not present

## 2017-07-04 DIAGNOSIS — K222 Esophageal obstruction: Secondary | ICD-10-CM

## 2017-07-04 DIAGNOSIS — D120 Benign neoplasm of cecum: Secondary | ICD-10-CM | POA: Diagnosis not present

## 2017-07-04 DIAGNOSIS — K621 Rectal polyp: Secondary | ICD-10-CM | POA: Diagnosis not present

## 2017-07-04 DIAGNOSIS — R1310 Dysphagia, unspecified: Secondary | ICD-10-CM | POA: Diagnosis not present

## 2017-07-04 DIAGNOSIS — D128 Benign neoplasm of rectum: Secondary | ICD-10-CM

## 2017-07-04 DIAGNOSIS — K2960 Other gastritis without bleeding: Secondary | ICD-10-CM

## 2017-07-04 DIAGNOSIS — R634 Abnormal weight loss: Secondary | ICD-10-CM | POA: Diagnosis not present

## 2017-07-04 DIAGNOSIS — Z86010 Personal history of colonic polyps: Secondary | ICD-10-CM

## 2017-07-04 DIAGNOSIS — R197 Diarrhea, unspecified: Secondary | ICD-10-CM | POA: Diagnosis not present

## 2017-07-04 MED ORDER — SODIUM CHLORIDE 0.9 % IV SOLN: 500.0000 mL | INTRAVENOUS | Status: DC

## 2017-07-04 NOTE — Progress Notes (Signed)
Called to room to assist during endoscopic procedure.  Patient ID and intended procedure confirmed with present staff. Received instructions for my participation in the procedure from the performing physician.  

## 2017-07-04 NOTE — Op Note (Signed)
[facility] Patient Name: Sandra Hughes Procedure Date: 07/04/2017 9:16 AM MRN: [id number] Endoscopist: Mauri Pole , MD Age: 76 Referring MD:  Date of Birth: [birth year] Gender: Female Account #: [id number] Procedure:                Colonoscopy Indications:              High risk colon cancer surveillance: Personal                            history of colonic polyps Medicines:                Monitored Anesthesia Care Procedure:                Pre-Anesthesia Assessment:                           - Prior to the procedure, a History and Physical                            was performed, and patient medications and                            allergies were reviewed. The patient's tolerance of                            previous anesthesia was also reviewed. The risks                            and benefits of the procedure and the sedation                            options and risks were discussed with the patient.                            All questions were answered, and informed consent                            was obtained. Prior Anticoagulants: The patient has                            taken no previous anticoagulant or antiplatelet                            agents. ASA Grade Assessment: II - A patient with                            mild systemic disease. After reviewing the risks                            and benefits, the patient was deemed in                            satisfactory condition to undergo the procedure.  After obtaining informed consent, the colonoscope                            was passed under direct vision. Throughout the                            procedure, the patient's blood pressure, pulse, and                            oxygen saturations were monitored continuously. The                            Colonoscope was introduced through the anus and                            advanced to the the cecum,  identified by                            appendiceal orifice and ileocecal valve. The                            colonoscopy was performed without difficulty. The                            patient tolerated the procedure well. The quality                            of the bowel preparation was good. The ileocecal                            valve, appendiceal orifice, and rectum were                            photographed. Scope In: 9:39:26 AM Scope Out: 9:53:41 AM Scope Withdrawal Time: 0 hours 11 minutes 23 seconds  Total Procedure Duration: 0 hours 14 minutes 15 seconds  Findings:                 The perianal and digital rectal examinations were                            normal.                           A 11 mm polyp was found in the ileocecal valve. The                            polyp was sessile. The polyp was removed with a                            cold snare. Resection and retrieval were complete.                           A 1 mm polyp was found in the rectum. The polyp was  sessile. The polyp was removed with a cold biopsy                            forceps. Resection and retrieval were complete.                           A few small-mouthed diverticula were found in the                            sigmoid colon and descending colon.                           Non-bleeding internal hemorrhoids were found during                            retroflexion. The hemorrhoids were small.                           The exam was otherwise without abnormality. Complications:            No immediate complications. Estimated Blood Loss:     Estimated blood loss was minimal. Impression:               - One 11 mm polyp at the ileocecal valve, removed                            with a cold snare. Resected and retrieved.                           - One 1 mm polyp in the rectum, removed with a cold                            biopsy forceps. Resected and retrieved.                            - Diverticulosis in the sigmoid colon and in the                            descending colon.                           - Non-bleeding internal hemorrhoids.                           - The examination was otherwise normal. Recommendation:           - Patient has a contact number available for                            emergencies. The signs and symptoms of potential                            delayed complications were discussed with the  patient. Return to normal activities tomorrow.                            Written discharge instructions were provided to the                            patient.                           - Resume previous diet.                           - Continue present medications.                           - Await pathology results.                           - Repeat colonoscopy in 3 - 5 years for                            surveillance based on pathology results. Mauri Pole, MD 07/04/2017 10:02:41 AM This report has been signed electronically.

## 2017-07-04 NOTE — Op Note (Signed)
[place] Patient Name: Sandra Hughes Procedure Date: 07/04/2017 9:17 AM MRN: [id number] Endoscopist: Mauri Pole , MD Age: 76 Referring MD:  Date of Birth: 04-05-1941 Gender: Female Account #: [id number] Procedure:                Upper GI endoscopy Indications:              Dysphagia, Weight loss Medicines:                Monitored Anesthesia Care Procedure:                Pre-Anesthesia Assessment:                           - Prior to the procedure, a History and Physical                            was performed, and patient medications and                            allergies were reviewed. The patient's tolerance of                            previous anesthesia was also reviewed. The risks                            and benefits of the procedure and the sedation                            options and risks were discussed with the patient.                            All questions were answered, and informed consent                            was obtained. Prior Anticoagulants: The patient has                            taken no previous anticoagulant or antiplatelet                            agents. ASA Grade Assessment: II - A patient with                            mild systemic disease. After reviewing the risks                            and benefits, the patient was deemed in                            satisfactory condition to undergo the procedure.                           After obtaining informed consent, the endoscope was  passed under direct vision. Throughout the                            procedure, the patient's blood pressure, pulse, and                            oxygen saturations were monitored continuously. The                            Model GIF-HQ190 [id number]) scope was introduced                            through the mouth, and advanced to the second part                            of duodenum. The upper GI  endoscopy was                            accomplished without difficulty. The patient                            tolerated the procedure well. Scope In: Scope Out: Findings:                 The examined duodenum was normal.                           Patchy mild inflammation characterized by erosions,                            erythema and mucus was found in the cardia, in the                            gastric body and in the gastric antrum. Biopsies                            were taken with a cold forceps for Helicobacter                            pylori testing.                           A 5 cm hiatal hernia was present.                           LA Grade D (one or more mucosal breaks involving at                            least 75% of esophageal circumference) esophagitis                            with few superficial ulcers and no bleeding was                            found 32  to 36 cm from the incisors.                           One moderate benign-appearing, intrinsic stenosis                            was found 34 to 35 cm from the incisors. This                            measured 1.5 cm (inner diameter) x 1 cm (in length)                            and was traversed. A TTS dilator was passed through                            the scope. Dilation with a 16-17-18 mm balloon                            dilator was performed to 18 mm. The dilation site                            was examined following endoscope reinsertion and                            showed moderate improvement in luminal narrowing. Complications:            No immediate complications. Estimated Blood Loss:     Estimated blood loss was minimal. Impression:               - Normal examined duodenum.                           - Gastritis. Biopsied.                           - 5 cm hiatal hernia.                           - LA Grade D erosive esophagitis.                           - Benign-appearing  esophageal stenosis. Dilated. Recommendation:           - Patient has a contact number available for                            emergencies. The signs and symptoms of potential                            delayed complications were discussed with the                            patient. Return to normal activities tomorrow.  Written discharge instructions were provided to the                            patient.                           - Resume previous diet.                           - Continue present medications.                           - Await pathology results. Mauri Pole, MD 07/04/2017 10:00:16 AM This report has been signed electronically.

## 2017-07-04 NOTE — Progress Notes (Signed)
No egg or soy allergy  No issues with sedation , no intubation issues  No A Fib or A Flutter per pt

## 2017-07-04 NOTE — Progress Notes (Signed)
Pt vomited during EGD no apparent aspiration. Sats remained good. Dr Silverio Decamp cleared stomach of all fluids. I cleared upper airway with suction.tb To PACU, VSS. Report to RN.tb

## 2017-07-04 NOTE — Patient Instructions (Signed)
YOU HAD AN ENDOSCOPIC PROCEDURE TODAY AT [facility]:   Refer to the procedure report that was given to you for any specific questions about what was found during the examination.  If the procedure report does not answer your questions, please call your gastroenterologist to clarify.  If you requested that your care partner not be given the details of your procedure findings, then the procedure report has been included in a sealed envelope for you to review at your convenience later.  YOU SHOULD EXPECT: Some feelings of bloating in the abdomen. Passage of more gas than usual.  Walking can help get rid of the air that was put into your GI tract during the procedure and reduce the bloating. If you had a lower endoscopy (such as a colonoscopy or flexible sigmoidoscopy) you may notice spotting of blood in your stool or on the toilet paper. If you underwent a bowel prep for your procedure, you may not have a normal bowel movement for a few days.  Please Note:  You might notice some irritation and congestion in your nose or some drainage.  This is from the oxygen used during your procedure.  There is no need for concern and it should clear up in a day or so.  SYMPTOMS TO REPORT IMMEDIATELY:   Following lower endoscopy (colonoscopy or flexible sigmoidoscopy):  Excessive amounts of blood in the stool  Significant tenderness or worsening of abdominal pains  Swelling of the abdomen that is new, acute  Fever of 100F or higher   Following upper endoscopy (EGD)  Vomiting of blood or coffee ground material  New chest pain or pain under the shoulder blades  Painful or persistently difficult swallowing  New shortness of breath  Fever of 100F or higher  Black, tarry-looking stools  For urgent or emergent issues, a gastroenterologist can be reached at any hour by calling [phone].   DIET:  We do recommend a small meal at first, but then you may proceed to your regular diet.  Drink  plenty of fluids but you should avoid alcoholic beverages for 24 hours.  ACTIVITY:  You should plan to take it easy for the rest of today and you should NOT DRIVE or use heavy machinery until tomorrow (because of the sedation medicines used during the test).    FOLLOW UP: Our staff will call the number listed on your records the next business day following your procedure to check on you and address any questions or concerns that you may have regarding the information given to you following your procedure. If we do not reach you, we will leave a message.  However, if you are feeling well and you are not experiencing any problems, there is no need to return our call.  We will assume that you have returned to your regular daily activities without incident.  If any biopsies were taken you will be contacted by phone or by letter within the next 1-3 weeks.  Please call us at [phone] if you have not heard about the biopsies in 3 weeks.    SIGNATURES/CONFIDENTIALITY: You and/or your care partner have signed paperwork which will be entered into your electronic medical record.  These signatures attest to the fact that that the information above on your After Visit Summary has been reviewed and is understood.  Full responsibility of the confidentiality of this discharge information lies with you and/or your care-partner.  Esophagitis, gastritis, hiatal hernia and post dilation diet given.  Polyp,  diverticulosis information given.  Follow soft diet for one week after endoscopy.

## 2017-07-07 ENCOUNTER — Telehealth: Payer: Self-pay

## 2017-07-07 NOTE — Telephone Encounter (Signed)
  Follow up Call-  Call back number 07/04/2017  Post procedure Call Back phone  # [phone]  Permission to leave phone message Yes  Some recent data might be hidden     Patient questions:  Do you have a fever, pain , or abdominal swelling? No. Pain Score  0 *  Have you tolerated food without any problems? Yes.    Have you been able to return to your normal activities? Yes.    Do you have any questions about your discharge instructions: Diet   No. Medications  No. Follow up visit  No.  Do you have questions or concerns about your Care? No.  Actions: * If pain score is 4 or above: No action needed, pain <4.

## 2017-07-08 IMAGING — CT CT ABDOMEN W/ CM
2 of 5 series · 15 of 46 positions shown, 17 images · IV contrast (APPLIED)
Comparison: MRI on 05/25/2016 and ultrasound on 12/21/2015

CLINICAL DATA: Upper anterior abdominal wall palpable mass.

EXAM:
CT ABDOMEN WITH CONTRAST
TECHNIQUE: Multidetector CT imaging of the abdomen was performed using the
standard protocol following bolus administration of intravenous
contrast.
CONTRAST:  100mL 6WAIJY-5RR IOPAMIDOL (6WAIJY-5RR) INJECTION 61%

[Series 2: axial st · axial · 0.84mm/px · z∈[-346,-76]mm · 12 of 64 slices shown, 14 images]
[im 5/64  soft-tissue]
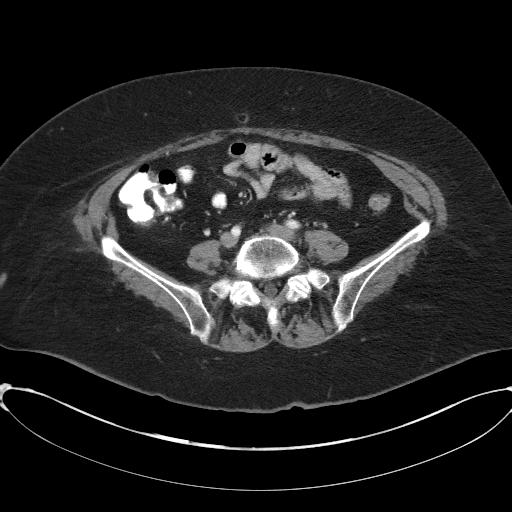
[im 5/64  bone]
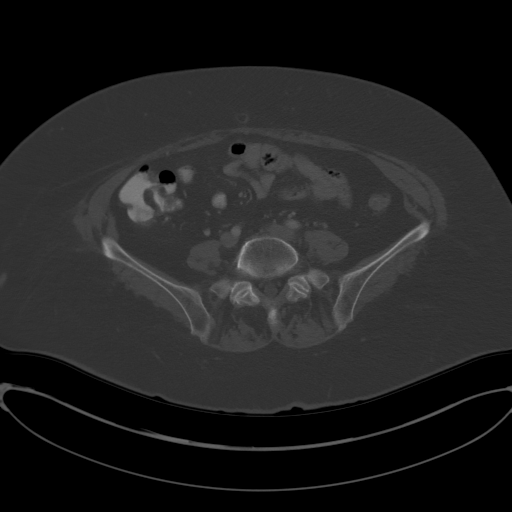
[im 9/64  soft-tissue]
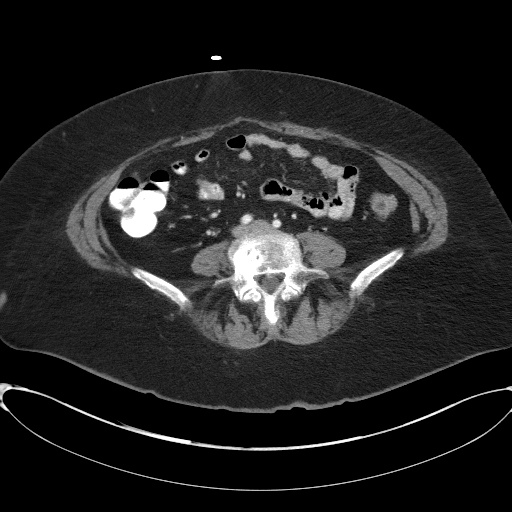
[im 13/64  soft-tissue]
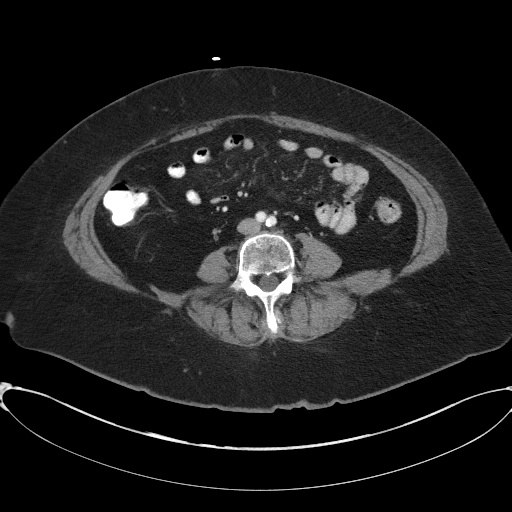
[im 22/64  soft-tissue]
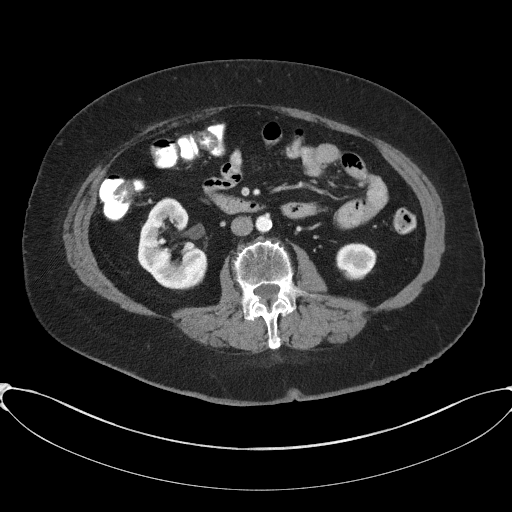
[im 26/64  soft-tissue]
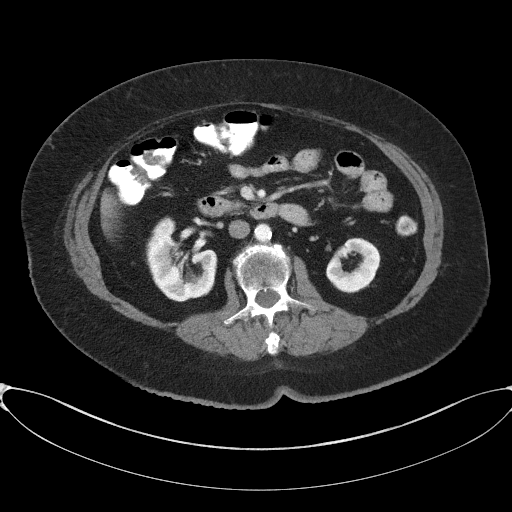
[im 30/64  soft-tissue]
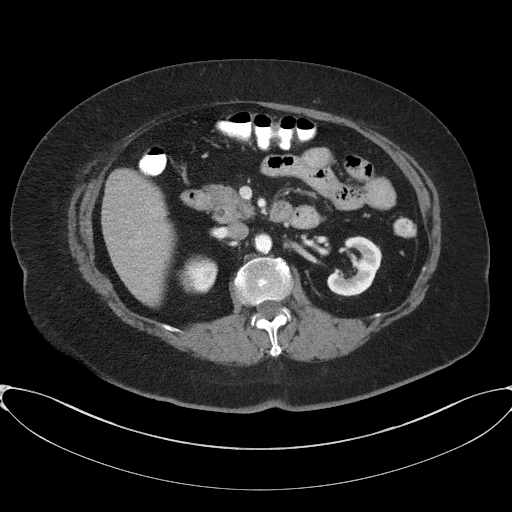
[im 34/64  soft-tissue]
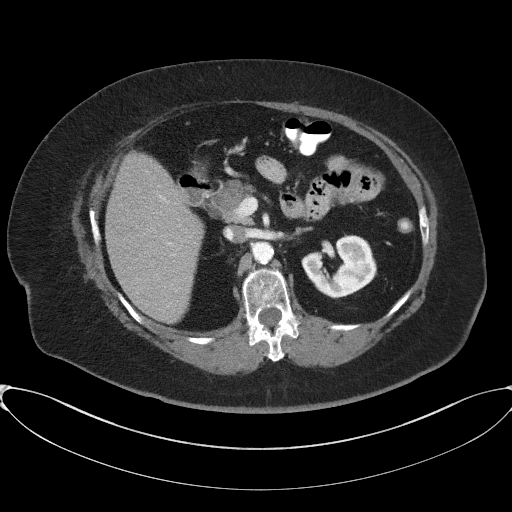
[im 38/64  soft-tissue]
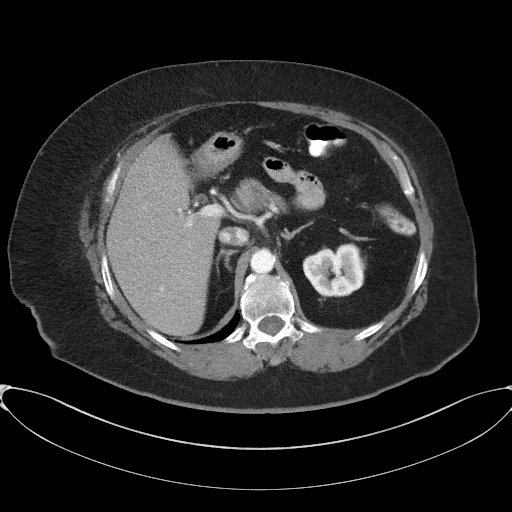
[im 43/64  soft-tissue]
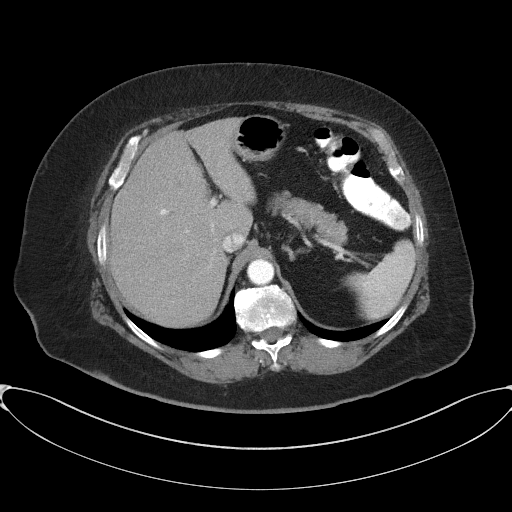
[im 43/64  bone]
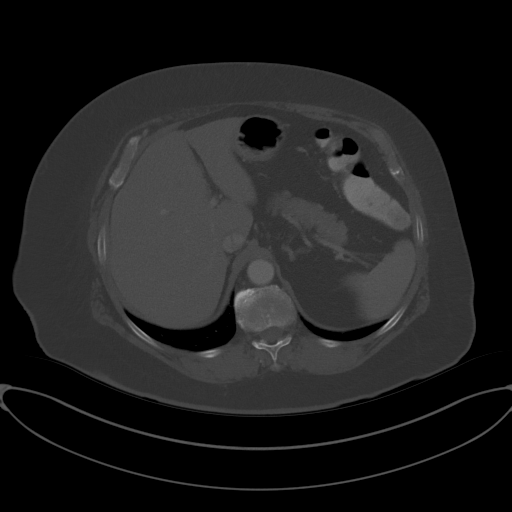
[im 51/64  soft-tissue]
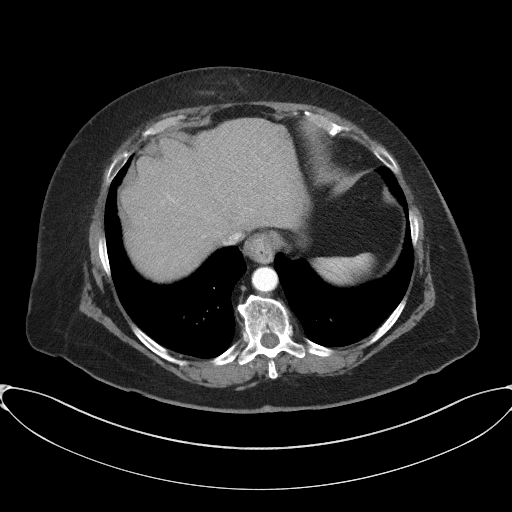
[im 55/64  soft-tissue]
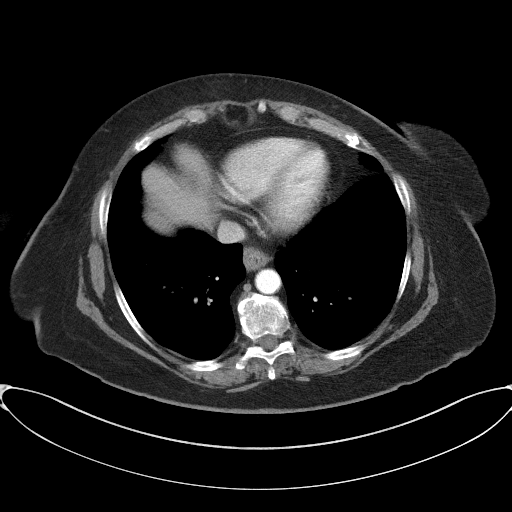
[im 59/64  soft-tissue]
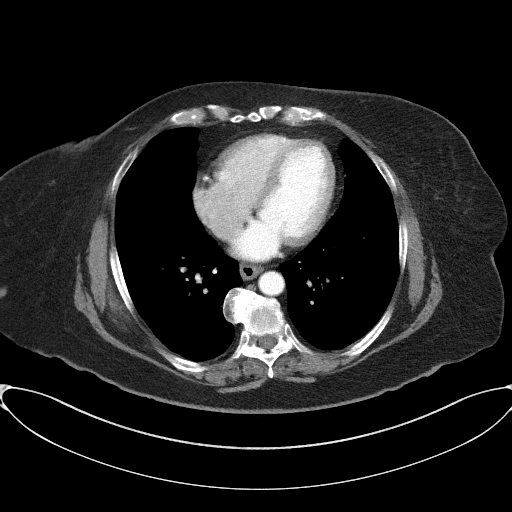

[Series 5: coronal st · coronal · 0.64mm/px · 3 of 104 slices shown]
[im 35/104  soft-tissue]
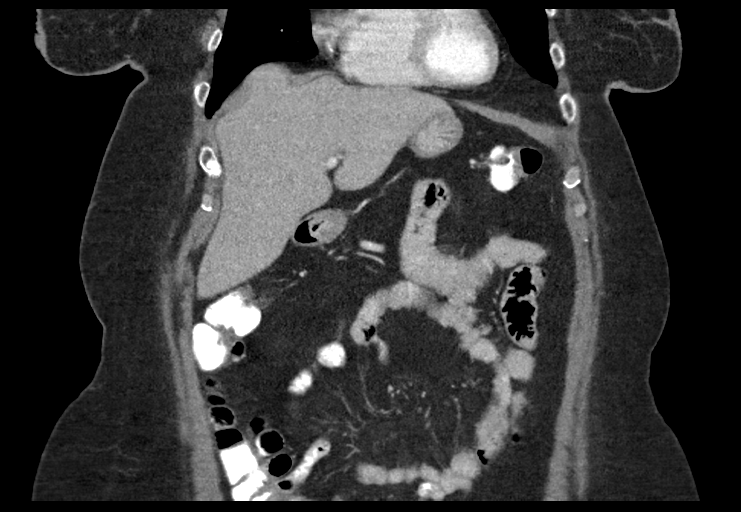
[im 46/104  soft-tissue]
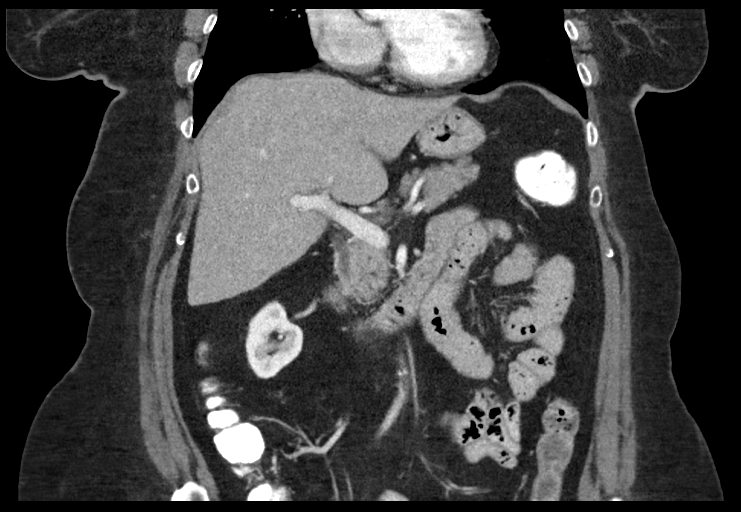
[im 58/104  soft-tissue]
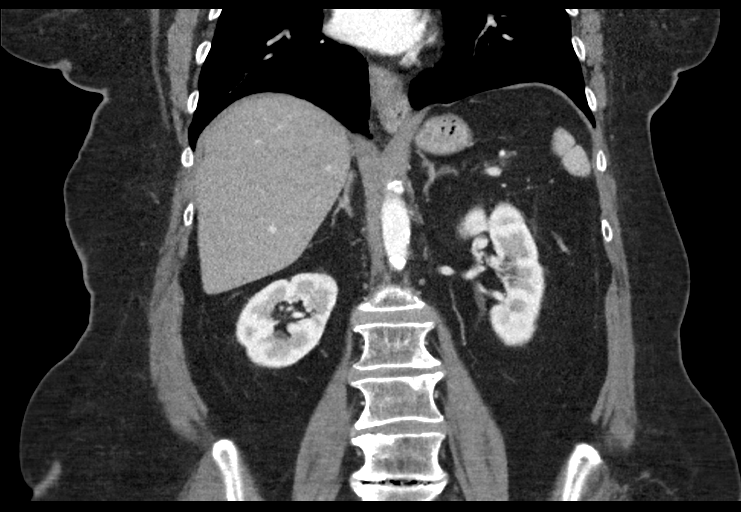

[15 of 46 positions shown; findings below may reference images not displayed]

FINDINGS: Lower chest: Noncalcified pulmonary nodule in posterior right lower
lobe measuring 6 mm on image [DATE]. 4 mm pulmonary nodule in left
lower lobe on image [DATE]. Tiny hiatal hernia also noted.

Hepatobiliary: No masses or other significant abnormality. Prior
cholecystectomy noted. No evidence of biliary dilatation.

Pancreas: No mass, inflammatory changes, or other significant
abnormality.

Spleen: Within normal limits in size and appearance.

Adrenals/Urinary Tract: No masses identified. No evidence of
hydronephrosis.

Stomach/Bowel: No evidence of obstruction, inflammatory process, or
abnormal fluid collections.

Vascular/Lymphatic: No pathologically enlarged lymph nodes. No
evidence of abdominal aortic aneurysm. Aortic atherosclerosis.

Other: A fat attenuation lesion is seen in the subcutaneous tissues
of the upper anterior abdominal wall in the subxiphoid region. This
measures approximately 2.4 x 4.8 cm on image [DATE], and shows minimal
internal soft tissue stranding, without evidence of focal soft
tissue nodule or solid component. No definite adjacent anterior
abdominal wall defect is seen. This favors a lipoma or fat necrosis
over hernia.

Musculoskeletal:  No suspicious bone lesions identified.
IMPRESSION: Fat attenuation lesion in subcutaneous tissues of upper anterior
abdominal wall in subxiphoid region. Differential diagnosis includes
lipoma, fat necrosis, or less likely hernia as no definite
associated abdominal wall defect is visualized.

Bilateral lower lobe indeterminate pulmonary nodules, largest
measuring 6 mm in right lower lobe. Non-contrast chest CT at 3-6
months is recommended. If the nodules are stable at time of repeat
CT, then future CT at 18-24 months (from today's scan) is considered
optional for low-risk patients, but is recommended for high-risk
patients. This recommendation follows the consensus statement:
Guidelines for Management of Incidental Pulmonary Nodules Detected
on CT Images:From the [HOSPITAL] 6636; published online
before print (10.1148/radiol.7908020270).

Aortic atherosclerosis.

## 2017-07-09 ENCOUNTER — Encounter: Payer: Self-pay | Admitting: Gastroenterology

## 2017-07-11 ENCOUNTER — Telehealth: Payer: Self-pay | Admitting: *Deleted

## 2017-07-11 ENCOUNTER — Encounter: Payer: Self-pay | Admitting: Gastroenterology

## 2017-07-11 NOTE — Telephone Encounter (Signed)
Done, thanks

## 2017-07-11 NOTE — Telephone Encounter (Signed)
Dr. Silverio Decamp,  Could you please change your pathology letter to reflect EGD results.  Thank you, Olivia Mackie

## 2017-07-16 ENCOUNTER — Other Ambulatory Visit: Payer: Self-pay | Admitting: Family Medicine

## 2017-07-16 DIAGNOSIS — E119 Type 2 diabetes mellitus without complications: Secondary | ICD-10-CM

## 2017-07-16 DIAGNOSIS — E785 Hyperlipidemia, unspecified: Secondary | ICD-10-CM

## 2017-07-16 DIAGNOSIS — F32A Depression, unspecified: Secondary | ICD-10-CM

## 2017-07-16 DIAGNOSIS — I10 Essential (primary) hypertension: Secondary | ICD-10-CM

## 2017-07-16 DIAGNOSIS — F329 Major depressive disorder, single episode, unspecified: Secondary | ICD-10-CM

## 2017-07-23 ENCOUNTER — Telehealth: Payer: Self-pay | Admitting: *Deleted

## 2017-07-23 MED ORDER — CHOLESTYRAMINE LIGHT 4 G PO PACK: 4.0000 g | PACK | Freq: Every day | ORAL | 3 refills | Status: DC

## 2017-07-23 NOTE — Telephone Encounter (Signed)
90 day request fax from pharmacy on Prevalite sent

## 2017-07-24 ENCOUNTER — Other Ambulatory Visit: Payer: Self-pay | Admitting: Family Medicine

## 2017-07-24 DIAGNOSIS — I10 Essential (primary) hypertension: Secondary | ICD-10-CM

## 2017-09-04 ENCOUNTER — Ambulatory Visit (INDEPENDENT_AMBULATORY_CARE_PROVIDER_SITE_OTHER): Payer: Medicare Other

## 2017-09-04 DIAGNOSIS — Z23 Encounter for immunization: Secondary | ICD-10-CM | POA: Diagnosis not present

## 2017-10-20 ENCOUNTER — Other Ambulatory Visit: Payer: Self-pay | Admitting: Family Medicine

## 2017-10-20 DIAGNOSIS — F329 Major depressive disorder, single episode, unspecified: Secondary | ICD-10-CM

## 2017-10-20 DIAGNOSIS — F32A Depression, unspecified: Secondary | ICD-10-CM

## 2017-10-20 DIAGNOSIS — E785 Hyperlipidemia, unspecified: Secondary | ICD-10-CM

## 2017-10-20 DIAGNOSIS — E119 Type 2 diabetes mellitus without complications: Secondary | ICD-10-CM

## 2017-10-20 DIAGNOSIS — I10 Essential (primary) hypertension: Secondary | ICD-10-CM

## 2017-10-21 ENCOUNTER — Other Ambulatory Visit: Payer: Self-pay | Admitting: Family Medicine

## 2018-01-15 ENCOUNTER — Other Ambulatory Visit: Payer: Self-pay | Admitting: Family Medicine

## 2018-01-19 ENCOUNTER — Other Ambulatory Visit: Payer: Self-pay | Admitting: Family Medicine

## 2018-01-19 DIAGNOSIS — I10 Essential (primary) hypertension: Secondary | ICD-10-CM

## 2018-04-09 ENCOUNTER — Other Ambulatory Visit: Payer: Self-pay | Admitting: Family Medicine

## 2018-04-13 ENCOUNTER — Other Ambulatory Visit: Payer: Self-pay | Admitting: Family Medicine

## 2018-04-13 DIAGNOSIS — I10 Essential (primary) hypertension: Secondary | ICD-10-CM

## 2018-04-15 DIAGNOSIS — H2513 Age-related nuclear cataract, bilateral: Secondary | ICD-10-CM | POA: Diagnosis not present

## 2018-04-15 DIAGNOSIS — E119 Type 2 diabetes mellitus without complications: Secondary | ICD-10-CM | POA: Diagnosis not present

## 2018-04-16 ENCOUNTER — Telehealth: Payer: Self-pay | Admitting: Family Medicine

## 2018-04-16 NOTE — Telephone Encounter (Signed)
Called and gave order for individual packs to pharmacy

## 2018-04-16 NOTE — Telephone Encounter (Signed)
Copied from [place] [id number]. Topic: Quick Communication - Rx Refill/Question >> Apr 16, 2018 11:24 AM Burchel, Abbi R wrote: Medication: cycloSPORINE (RESTASIS) 0.05 % ophthalmic emulsion  [place] [phone]  Restasis multi-dose is not available.  Can Dr Lorelei Pont prescribe the individual trays?  It is the only kind that is available.

## 2018-04-24 ENCOUNTER — Other Ambulatory Visit: Payer: Self-pay | Admitting: Family Medicine

## 2018-04-24 DIAGNOSIS — Z1231 Encounter for screening mammogram for malignant neoplasm of breast: Secondary | ICD-10-CM

## 2018-04-28 NOTE — Progress Notes (Addendum)
[facility] [address], [place], [place] [address] [phone]  Date:  04/30/2018   Name:  Sandra Hughes   DOB:  06/01/1941   MRN:  [id number]  PCP:  Darreld Mclean, MD    Chief Complaint: Medication Check (refills,medications are effecting her eyes-trazadone and celexa)   History of Present Illness:  Sandra Hughes is a 77 y.o. very pleasant female patient who presents with the following:  Following up today- last seen here one year ago History of HTN, DM, hypothyroidism, hyperlipidemia From my last note: Here today with a few concerns Last seen here about 15 months ago Check on her lipids, thyroid, Dm Explained my concerns about her weight loss and swallowing problem, could be a sign of a serious problem such as cancer, or might be a stricture.  Need to have her see GI right away  Her epigastric mass was evaluated with [place], MRI and CT last year and there was no concern of malignancy- likely a lipoma or fat necrosis.  This is probably unrelated to her swallowing problem and weight loss She has probably scratched her ear and caused a mild OE- treated with acetic acid  She was referred to GI-  Had a colonoscopy and upper GI in October of 2018 She noted that she swallowing problem unfortunately did not get better after her upper GI procedure- she has learned to be careful about what she eats and is managing ok, her weight is stable as below Bread esp will get stuck if she is not careful She is able to wash down with water Meats can get stuck as well  Wt Readings from Last 3 Encounters:  04/30/18 205 lb (93 kg)  07/04/17 208 lb (94.3 kg)  06/30/17 208 lb 3.2 oz (94.4 kg)   She may notice that her eyes appear to be red some mornings. She is using her restasis drops still and just seems to have very dry eyes She read that this redness might be due to celexa or her trazodone- this seems unlikely but we can work on  coming off these meds if no longer needed  She wears glasses but no contacts.   Eye exam:  Done last week, all ok per pt  A1c is well overdue Foot exam is due Labs overall are due Offer shingrix but would need to have done at drug store  She notes that she often feels cold over the last year when others are warm Her mood is overall very good- she would like to try and come off celexa which she has been on for a decade.  She feels like her mood is very good and like she does not need to continue to take this med.  Will leave her on the trazodone so as to not make too many changes at once.   She is on lasix 40 BID and potasium   Noted that her BP is well controlled but her pulse is a bit slow She is taking quinapirl and atenolol for her BP in addition to lasix   Lab Results  Component Value Date   HGBA1C 5.8 04/30/2018   Lab Results  Component Value Date   TSH 0.79 04/30/2018     Patient Active Problem List   Diagnosis Date Noted  . Pulmonary nodule 11/21/2016  . HTN (hypertension) 05/15/2012  . Edema 05/15/2012  . Allergic rhinitis 05/15/2012  . Diabetes mellitus type II  05/15/2012  . Osteopenia 05/15/2012  . Depression 05/15/2012  . Hypothyroidism 05/15/2012  . GERD (gastroesophageal reflux disease) 05/15/2012  . Hypercholesterolemia 05/15/2012  . Vitamin D deficiency 05/15/2012    Past Medical History:  Diagnosis Date  . Allergy   . Anemia   . Anxiety   . Arthritis   . Cataract   . Depression   . Diabetes mellitus without complication ([facility])   . GERD (gastroesophageal reflux disease)   . Hyperlipidemia   . Hypertension   . Osteoporosis   . Thyroid disease     Past Surgical History:  Procedure Laterality Date  . ABDOMINAL HYSTERECTOMY     no cancer  . CHOLECYSTECTOMY    . COLONOSCOPY    . POLYPECTOMY    . UPPER GASTROINTESTINAL ENDOSCOPY      Social History   Tobacco Use  . Smoking status: Never Smoker  . Smokeless tobacco: Never Used   Substance Use Topics  . Alcohol use: No  . Drug use: No    Family History  Problem Relation Age of Onset  . Hypertension Mother   . COPD Father   . Colon polyps Father   . Arthritis Sister   . Diabetes Brother   . Cancer Maternal Grandfather        skin cancer  . Heart disease Paternal Grandfather   . Diabetes Sister   . Colon cancer Neg Hx   . Esophageal cancer Neg Hx   . Rectal cancer Neg Hx   . Stomach cancer Neg Hx     Allergies  Allergen Reactions  . Aspirin Anaphylaxis    Medication list has been reviewed and updated.  Current Outpatient Medications on File Prior to Visit  Medication Sig Dispense Refill  . citalopram (CELEXA) 20 MG tablet TAKE 1 TABLET BY MOUTH EVERY DAY 90 tablet 3   No current facility-administered medications on file prior to visit.    Outpatient Encounter Medications as of 04/30/2018  Medication Sig  . atenolol (TENORMIN) 25 MG tablet Take 1 tablet (25 mg total) by mouth daily.  . citalopram (CELEXA) 20 MG tablet TAKE 1 TABLET BY MOUTH EVERY DAY  . cycloSPORINE (RESTASIS) 0.05 % ophthalmic emulsion Place 1 drop into both eyes 2 (two) times daily.  . furosemide (LASIX) 40 MG tablet Take 1 tablet (40 mg total) by mouth 2 (two) times daily.  [place] Kitchen levothyroxine (SYNTHROID, LEVOTHROID) 100 MCG tablet Take 1 tablet (100 mcg total) by mouth daily.  Marland Kitchen lovastatin (MEVACOR) 40 MG tablet Take 1 tablet (40 mg total) by mouth daily.  . metFORMIN (GLUCOPHAGE) 500 MG tablet TAKE 1 TABLET BY MOUTH EVERY DAY WITH BREAKFAST  . potassium chloride (KLOR-CON M10) 10 MEQ tablet Take 1 tablet (10 mEq total) by mouth daily.  . quinapril (ACCUPRIL) 20 MG tablet TAKE 1 TABLET BY MOUTH EVERY DAY IN THE MORNING  . traZODone (DESYREL) 100 MG tablet Take 0.5 tablets (50 mg total) by mouth at bedtime.  . [DISCONTINUED] atenolol (TENORMIN) 25 MG tablet TAKE 1 TABLET BY MOUTH EVERY DAY  . [DISCONTINUED] cycloSPORINE (RESTASIS) 0.05 % ophthalmic emulsion Place 1 drop into both  eyes 2 (two) times daily.  . [DISCONTINUED] furosemide (LASIX) 40 MG tablet TAKE 1 TABLET BY MOUTH TWICE A DAY  . [DISCONTINUED] KLOR-CON M10 10 MEQ tablet TAKE 1 TABLET BY MOUTH EVERY DAY  . [DISCONTINUED] levothyroxine (SYNTHROID, LEVOTHROID) 100 MCG tablet TAKE 1 TABLET BY MOUTH EVERY DAY  . [DISCONTINUED] lovastatin (MEVACOR) 40 MG tablet TAKE 1 TABLET  BY MOUTH EVERY DAY  . [DISCONTINUED] metFORMIN (GLUCOPHAGE) 500 MG tablet TAKE 1 TABLET BY MOUTH EVERY DAY WITH BREAKFAST  . [DISCONTINUED] quinapril (ACCUPRIL) 20 MG tablet TAKE 1 TABLET BY MOUTH EVERY DAY IN THE MORNING  . [DISCONTINUED] traZODone (DESYREL) 100 MG tablet TAKE 1/2 TABLET BY MOUTH AT BEDTIME  . [DISCONTINUED] cholestyramine light (PREVALITE) 4 g packet Take 1 packet (4 g total) by mouth at bedtime.   No facility-administered encounter medications on file as of 04/30/2018.     Review of Systems:  As per HPI- otherwise negative. No fevers or chills No CP or SOB She is not really checking her glucose but has not noted any sx of hypoglycemia.    Physical Examination: Vitals:   04/30/18 1040  BP: 126/70  Pulse: (!) 52  Resp: 16  SpO2: 96%   Vitals:   04/30/18 1040  Weight: 205 lb (93 kg)  Height: 5' 3.5" (1.613 m)   Body mass index is 35.74 kg/m. Ideal Body Weight: Weight in (lb) to have BMI = 25: 143.1  GEN: WDWN, NAD, Non-toxic, A & O x 3, obese, looks well  HEENT: Atraumatic, Normocephalic. Neck supple. No masses, No LAD.  Bilateral TM wnl, oropharynx normal.  PEERL,EOMI.   Ears and Nose: No external deformity. CV: RRR, No M/G/R. No JVD. No thrill. No extra heart sounds. PULM: CTA B, no wheezes, crackles, rhonchi. No retractions. No resp. distress. No accessory muscle use. ABD: S, NT, ND. No rebound. No HSM. EXTR: No c/c/e NEURO Normal gait for pt, she does use a cane Foot exam normal today  PSYCH: Normally interactive. Conversant. Not depressed or anxious appearing.  Calm demeanor.  BP Readings from Last  3 Encounters:  04/30/18 126/70  07/04/17 125/69  06/30/17 108/68   Pulse Readings from Last 3 Encounters:  04/30/18 (!) 52  07/04/17 (!) 50  06/30/17 60     Assessment and Plan: Essential hypertension, benign - Plan: CBC, Comprehensive metabolic panel, atenolol (TENORMIN) 25 MG tablet, potassium chloride (KLOR-CON M10) 10 MEQ tablet, furosemide (LASIX) 40 MG tablet, quinapril (ACCUPRIL) 20 MG tablet  Controlled type 2 diabetes mellitus without complication, without long-term current use of insulin (HCC) - Plan: Comprehensive metabolic panel, Hemoglobin A1c, metFORMIN (GLUCOPHAGE) 500 MG tablet  Mixed hyperlipidemia - Plan: Lipid panel  Hypothyroidism (acquired) - Plan: TSH, CANCELED: TSH  Medication monitoring encounter - Plan: CBC  Hyperlipidemia - Plan: lovastatin (MEVACOR) 40 MG tablet  Depression - Plan: traZODone (DESYREL) 100 MG tablet  Dry eyes - Plan: cycloSPORINE (RESTASIS) 0.05 % ophthalmic emulsion  Bradycardia  Following up today, last seen a year ago Catching up on labs, refilled meds Discussed how to taper off celexa- she wishes to try stopping this med and see how she does which is ok with me. She will let mekonw how this does for her Asked her to check her BP and pulse at home a few times and contact me with some readings and she agrees- may need to stop her BB  Will plan further follow- up pending labs.   Signed Lamar Blinks, MD  Will need to refill thyroid med once TSH comes in - dont   Received her labs 8/2- letter to pt  Results for orders placed or performed in visit on 04/30/18  CBC  Result Value Ref Range   WBC 6.3 4.0 - 10.5 K/uL   RBC 4.91 3.87 - 5.11 Mil/uL   Platelets 302.0 150.0 - 400.0 K/uL   Hemoglobin 15.1 (H) 12.0 -  15.0 g/dL   HCT 44.5 36.0 - 46.0 %   MCV 90.7 78.0 - 100.0 fl   MCHC 33.8 30.0 - 36.0 g/dL   RDW 14.1 11.5 - 15.5 %  Comprehensive metabolic panel  Result Value Ref Range   Sodium 140 135 - 145 mEq/L   Potassium  4.3 3.5 - 5.1 mEq/L   Chloride 101 96 - 112 mEq/L   CO2 31 19 - 32 mEq/L   Glucose, Bld 124 (H) 70 - 99 mg/dL   BUN 21 6 - 23 mg/dL   Creatinine, Ser 1.24 (H) 0.40 - 1.20 mg/dL   Total Bilirubin 0.4 0.2 - 1.2 mg/dL   Alkaline Phosphatase 66 39 - 117 U/L   AST 15 0 - 37 U/L   ALT 11 0 - 35 U/L   Total Protein 7.2 6.0 - 8.3 g/dL   Albumin 4.1 3.5 - 5.2 g/dL   Calcium 9.4 8.4 - 10.5 mg/dL   GFR 44.72 (L) >60.00 mL/min  Hemoglobin A1c  Result Value Ref Range   Hgb A1c MFr Bld 5.8 4.6 - 6.5 %  Lipid panel  Result Value Ref Range   Cholesterol 190 0 - 200 mg/dL   Triglycerides 156.0 (H) 0.0 - 149.0 mg/dL   HDL 67.30 >39.00 mg/dL   VLDL 31.2 0.0 - 40.0 mg/dL   LDL Cholesterol 91 0 - 99 mg/dL   Total CHOL/HDL Ratio 3    NonHDL 122.56   TSH  Result Value Ref Range   TSH 0.79 0.35 - 4.50 uIU/mL

## 2018-04-30 ENCOUNTER — Encounter: Payer: Self-pay | Admitting: Family Medicine

## 2018-04-30 ENCOUNTER — Ambulatory Visit (HOSPITAL_BASED_OUTPATIENT_CLINIC_OR_DEPARTMENT_OTHER)
Admission: RE | Admit: 2018-04-30 | Discharge: 2018-04-30 | Disposition: A | Discharge status: Home / Self Care | Payer: Medicare Other | Source: Ambulatory Visit | Attending: Family Medicine | Admitting: Family Medicine

## 2018-04-30 ENCOUNTER — Ambulatory Visit (INDEPENDENT_AMBULATORY_CARE_PROVIDER_SITE_OTHER): Payer: Medicare Other | Admitting: Family Medicine

## 2018-04-30 VITALS — BP 126/70 | HR 52 | Resp 16 | Ht 63.5 in | Wt 205.0 lb

## 2018-04-30 DIAGNOSIS — E785 Hyperlipidemia, unspecified: Secondary | ICD-10-CM

## 2018-04-30 DIAGNOSIS — E039 Hypothyroidism, unspecified: Secondary | ICD-10-CM

## 2018-04-30 DIAGNOSIS — Z1231 Encounter for screening mammogram for malignant neoplasm of breast: Secondary | ICD-10-CM | POA: Diagnosis not present

## 2018-04-30 DIAGNOSIS — E119 Type 2 diabetes mellitus without complications: Secondary | ICD-10-CM

## 2018-04-30 DIAGNOSIS — Z5181 Encounter for therapeutic drug level monitoring: Secondary | ICD-10-CM

## 2018-04-30 DIAGNOSIS — F32A Depression, unspecified: Secondary | ICD-10-CM

## 2018-04-30 DIAGNOSIS — E782 Mixed hyperlipidemia: Secondary | ICD-10-CM | POA: Diagnosis not present

## 2018-04-30 DIAGNOSIS — F329 Major depressive disorder, single episode, unspecified: Secondary | ICD-10-CM

## 2018-04-30 DIAGNOSIS — H04123 Dry eye syndrome of bilateral lacrimal glands: Secondary | ICD-10-CM

## 2018-04-30 DIAGNOSIS — I10 Essential (primary) hypertension: Secondary | ICD-10-CM

## 2018-04-30 DIAGNOSIS — R001 Bradycardia, unspecified: Secondary | ICD-10-CM | POA: Diagnosis not present

## 2018-04-30 LAB — CBC
HEMATOCRIT: 44.5 % (ref 36.0–46.0)
HEMOGLOBIN: 15.1 g/dL — AB (ref 12.0–15.0)
MCHC: 33.8 g/dL (ref 30.0–36.0)
MCV: 90.7 fl (ref 78.0–100.0)
Platelets: 302 10*3/uL (ref 150.0–400.0)
RBC: 4.91 Mil/uL (ref 3.87–5.11)
RDW: 14.1 % (ref 11.5–15.5)
WBC: 6.3 10*3/uL (ref 4.0–10.5)

## 2018-04-30 LAB — COMPREHENSIVE METABOLIC PANEL
ALBUMIN: 4.1 g/dL (ref 3.5–5.2)
ALK PHOS: 66 U/L (ref 39–117)
ALT: 11 U/L (ref 0–35)
AST: 15 U/L (ref 0–37)
BILIRUBIN TOTAL: 0.4 mg/dL (ref 0.2–1.2)
BUN: 21 mg/dL (ref 6–23)
CO2: 31 mEq/L (ref 19–32)
Calcium: 9.4 mg/dL (ref 8.4–10.5)
Chloride: 101 mEq/L (ref 96–112)
Creatinine, Ser: 1.24 mg/dL — ABNORMAL HIGH (ref 0.40–1.20)
GFR: 44.72 mL/min — ABNORMAL LOW (ref >60.00)
Glucose, Bld: 124 mg/dL — ABNORMAL HIGH (ref 70–99)
POTASSIUM: 4.3 meq/L (ref 3.5–5.1)
SODIUM: 140 meq/L (ref 135–145)
TOTAL PROTEIN: 7.2 g/dL (ref 6.0–8.3)

## 2018-04-30 LAB — LIPID PANEL
CHOL/HDL RATIO: 3
Cholesterol: 190 mg/dL (ref 0–200)
HDL: 67.3 mg/dL (ref >39.00)
LDL Cholesterol: 91 mg/dL (ref 0–99)
NONHDL: 122.56
Triglycerides: 156 mg/dL — ABNORMAL HIGH (ref 0.0–149.0)
VLDL: 31.2 mg/dL (ref 0.0–40.0)

## 2018-04-30 LAB — HEMOGLOBIN A1C: HEMOGLOBIN A1C: 5.8 % (ref 4.6–6.5)

## 2018-04-30 LAB — TSH: TSH: 0.79 u[IU]/mL (ref 0.35–4.50)

## 2018-04-30 MED ORDER — TRAZODONE HCL 100 MG PO TABS: 50.0000 mg | ORAL_TABLET | Freq: Every day | ORAL | 3 refills | Status: DC

## 2018-04-30 MED ORDER — ATENOLOL 25 MG PO TABS: 25.0000 mg | ORAL_TABLET | Freq: Every day | ORAL | 3 refills | Status: DC

## 2018-04-30 MED ORDER — CYCLOSPORINE 0.05 % OP EMUL: 1.0000 [drp] | Freq: Two times a day (BID) | OPHTHALMIC | 3 refills | Status: DC

## 2018-04-30 MED ORDER — POTASSIUM CHLORIDE CRYS ER 10 MEQ PO TBCR: 10.0000 meq | EXTENDED_RELEASE_TABLET | Freq: Every day | ORAL | 3 refills | Status: DC

## 2018-04-30 MED ORDER — QUINAPRIL HCL 20 MG PO TABS: ORAL_TABLET | ORAL | 3 refills | Status: DC

## 2018-04-30 MED ORDER — METFORMIN HCL 500 MG PO TABS: ORAL_TABLET | ORAL | 3 refills | Status: DC

## 2018-04-30 MED ORDER — LOVASTATIN 40 MG PO TABS: 40.0000 mg | ORAL_TABLET | Freq: Every day | ORAL | 3 refills | Status: DC

## 2018-04-30 MED ORDER — FUROSEMIDE 40 MG PO TABS: 40.0000 mg | ORAL_TABLET | Freq: Two times a day (BID) | ORAL | 3 refills | Status: DC

## 2018-04-30 NOTE — Patient Instructions (Addendum)
It was very nice to see you today- I will be in touch with your labs asap To taper off celexa- take a 1/2 pill daily for 7- 10 days. Then take every other day for 7- 10 days and then stop.  If you are not doing ok off the medicine and need to re-start please alert me!  Please let me know if this seem to help your sleep at all   You might want to have the shingrix shingles vaccine given at your drug store  I will refill your thyroid medication once your TSH comes in- I need to see if any adjustment is needed   Please come and see me in 6 months assuming your labs look good- sooner if you have any concerns

## 2018-05-01 MED ORDER — LEVOTHYROXINE SODIUM 100 MCG PO TABS: 100.0000 ug | ORAL_TABLET | Freq: Every day | ORAL | 3 refills | Status: DC

## 2018-05-01 NOTE — Addendum Note (Signed)
Addended by: Lamar Blinks C on: 05/01/2018 02:09 PM   Modules accepted: Orders

## 2018-05-13 ENCOUNTER — Telehealth: Payer: Self-pay

## 2018-05-13 DIAGNOSIS — I10 Essential (primary) hypertension: Secondary | ICD-10-CM

## 2018-05-13 NOTE — Telephone Encounter (Signed)
Copied from [place] [id number]. Topic: General - Other >> May 13, 2018 12:06 PM Keene Breath wrote: Reason for CRM: Patient called to inform Dr. Lorelei Pont of her BP and Pulse reading.  Patient stated that her BP has been really good.  The last reading was 131/75 today.  She is concerned with her Pulse reading - 50 today.  Please advise.  CB# [phone].

## 2018-05-14 NOTE — Telephone Encounter (Signed)
Called her-  Her pulse is in the 50s still which concerns her. She does not really have any sx of same however She is on atenolol 25 and accupuril 20 She will take a 1/2 dose of atenolol for 4-5 days and then stop. She does not have any history of MI  She did have an essentially normal echo in 2017 as well as a dobutamine as follows:   Notes Recorded by Fay Records, MD on 05/12/2016 at 10:43 PM EDT Dobutamine echo is normal Nothing to suggest blood flow problme to heart Pumping function is normal Chest pain does not appear to be due to problem from heart. BP Readings from Last 3 Encounters:  04/30/18 126/70  07/04/17 125/69  06/30/17 108/68   She will monitor her BP- if she comes up with DC atenolol we can increase her accupril dose She will do so

## 2018-05-26 MED ORDER — ATENOLOL 25 MG PO TABS: 25.0000 mg | ORAL_TABLET | Freq: Every day | ORAL | 3 refills | Status: DC

## 2018-05-26 NOTE — Telephone Encounter (Signed)
Called her- she tried coming off atenolol totally and is having sx. She will go back to a 1/2 tablet and see how she does

## 2018-05-26 NOTE — Addendum Note (Signed)
Addended by: Lamar Blinks C on: 05/26/2018 05:06 PM   Modules accepted: Orders

## 2018-05-26 NOTE — Telephone Encounter (Signed)
Pt called stated that she would like a call back. Blood pressure today was 108/56 and her pulse was 93. Please advise [phone]

## 2018-07-02 ENCOUNTER — Telehealth: Payer: Self-pay | Admitting: Family Medicine

## 2018-07-02 ENCOUNTER — Other Ambulatory Visit: Payer: Self-pay | Admitting: Family Medicine

## 2018-07-02 MED ORDER — GLUCOSE BLOOD VI STRP: ORAL_STRIP | 12 refills | Status: DC

## 2018-07-02 NOTE — Telephone Encounter (Signed)
Copied from [place] [id number]. Topic: Quick Communication - Rx Refill/Question >> Jul 02, 2018 12:58 PM Wynetta Emery, [place] C wrote: Medication: One Touch test strips   Has the patient contacted their pharmacy? Yes  (Agent: If no, request that the patient contact the pharmacy for the refill.) (Agent: If yes, when and what did the pharmacy advise?)  Preferred Pharmacy (with phone number or street name): [id number] - [place], [place] [address] [phone] (Phone) [phone] (Fax)    Agent: Please be advised that RX refills may take up to 3 business days. We ask that you follow-up with your pharmacy.

## 2018-07-14 NOTE — Telephone Encounter (Signed)
[facility] is calling to state the patient is upset and the medication refill for the one touch test strip needs direction for medicare.

## 2018-07-16 MED ORDER — GLUCOSE BLOOD VI STRP: ORAL_STRIP | 12 refills | Status: AC

## 2018-07-16 NOTE — Telephone Encounter (Signed)
Refill on test strips including directions have been sent to pharmacy.

## 2018-07-16 NOTE — Addendum Note (Signed)
Addended by: Wynonia Musty A on: 07/16/2018 03:04 PM   Modules accepted: Orders

## 2018-08-04 NOTE — Progress Notes (Addendum)
[facility] [address] [place], [place] [address] [phone]  Date:  08/06/2018   Name:  JAMIELYN PETRUCCI   DOB:  02-11-1941   MRN:  [id number]  PCP:  Darreld Mclean, MD    Chief Complaint: Medication Check (flu shot)   History of Present Illness:  Sandra Hughes is a 77 y.o. very pleasant female patient who presents with the following:  Checking on meds today and would like a flu shot History of HTN, DM, osteopenia, GERD, hyperlipidemia, depression, vit D def  Pulmonary nodule- due for CT now  CT chest from 2/18 IMPRESSION: Stable bilateral lower lobe pulmonary nodules. The left lower lobe nodule may be calcified. These could be followed with repeat CT 18-24 months from the original study performed 05/30/2016. Old granulomatous disease. Coronary artery disease.  Last visit in August- at that time she wanted to come off celexa as she felt like she no longer needed it Lab Results  Component Value Date   HGBA1C 5.7 08/06/2018  flu shot needed- done today  ?shingrix- not in stock today   She if concerned that her DBP is in the 70s- she feels like she feels better when it is in the 60s.  Blood sugar readings from home show good control A1c today   Lasix 40  Thyroid med Lovastatin Metformin  K 10 meq daily accupril Trazodone   Lab Results  Component Value Date   TSH 0.79 04/30/2018     Her sleep is ok!  She feels like the trazodone is helping her. She generally takes 50 mg at bedtime  She notes a long history of GERD and is using Tums right now  She has epigastric pain esp on an empty stomach.  Her son suggested omeprazole to her   Patient Active Problem List   Diagnosis Date Noted  . Pulmonary nodule 11/21/2016  . HTN (hypertension) 05/15/2012  . Edema 05/15/2012  . Allergic rhinitis 05/15/2012  . Diabetes mellitus type II 05/15/2012  . Osteopenia 05/15/2012  . Depression 05/15/2012   . Hypothyroidism 05/15/2012  . GERD (gastroesophageal reflux disease) 05/15/2012  . Hypercholesterolemia 05/15/2012  . Vitamin D deficiency 05/15/2012    Past Medical History:  Diagnosis Date  . Allergy   . Anemia   . Anxiety   . Arthritis   . Cataract   . Depression   . Diabetes mellitus without complication ([place])   . GERD (gastroesophageal reflux disease)   . Hyperlipidemia   . Hypertension   . Osteoporosis   . Thyroid disease     Past Surgical History:  Procedure Laterality Date  . ABDOMINAL HYSTERECTOMY     no cancer  . CHOLECYSTECTOMY    . COLONOSCOPY    . POLYPECTOMY    . UPPER GASTROINTESTINAL ENDOSCOPY      Social History   Tobacco Use  . Smoking status: Never Smoker  . Smokeless tobacco: Never Used  Substance Use Topics  . Alcohol use: No  . Drug use: No    Family History  Problem Relation Age of Onset  . Hypertension Mother   . COPD Father   . Colon polyps Father   . Arthritis Sister   . Diabetes Brother   . Cancer Maternal Grandfather        skin cancer  . Heart disease Paternal Grandfather   . Diabetes Sister   . Colon cancer Neg Hx   . Esophageal cancer  Neg Hx   . Rectal cancer Neg Hx   . Stomach cancer Neg Hx     Allergies  Allergen Reactions  . Aspirin Anaphylaxis    Medication list has been reviewed and updated.  Current Outpatient Medications on File Prior to Visit  Medication Sig Dispense Refill  . cycloSPORINE (RESTASIS) 0.05 % ophthalmic emulsion Place 1 drop into both eyes 2 (two) times daily. 5.5 mL 3  . furosemide (LASIX) 40 MG tablet Take 1 tablet (40 mg total) by mouth 2 (two) times daily. 180 tablet 3  . glucose blood (ONE TOUCH ULTRA TEST) test strip Check glucose 3 times daily 300 each 12  . levothyroxine (SYNTHROID, LEVOTHROID) 100 MCG tablet Take 1 tablet (100 mcg total) by mouth daily. 90 tablet 3  . lovastatin (MEVACOR) 40 MG tablet Take 1 tablet (40 mg total) by mouth daily. 90 tablet 3  . metFORMIN  (GLUCOPHAGE) 500 MG tablet TAKE 1 TABLET BY MOUTH EVERY DAY WITH BREAKFAST 90 tablet 3  . potassium chloride (KLOR-CON M10) 10 MEQ tablet Take 1 tablet (10 mEq total) by mouth daily. 90 tablet 3  . quinapril (ACCUPRIL) 20 MG tablet TAKE 1 TABLET BY MOUTH EVERY DAY IN THE MORNING 90 tablet 3  . traZODone (DESYREL) 100 MG tablet Take 0.5 tablets (50 mg total) by mouth at bedtime. 45 tablet 3   No current facility-administered medications on file prior to visit.     Review of Systems:  As per HPI- otherwise negative. No fever or chills No CP or SOB    Physical Examination: Vitals:   08/06/18 0900  BP: 128/70  Pulse: 97  Resp: 18  Temp: 98.1 F (36.7 C)  SpO2: 97%   Vitals:   08/06/18 0900  Weight: 211 lb 6.4 oz (95.9 kg)  Height: 5\' 3"  (1.6 m)   Body mass index is 37.45 kg/m. Ideal Body Weight: Weight in (lb) to have BMI = 25: 140.8  GEN: WDWN, NAD, Non-toxic, A & O x 3, obese, looks well  HEENT: Atraumatic, Normocephalic. Neck supple. No masses, No LAD. Ears and Nose: No external deformity. CV: RRR, No M/G/R. No JVD. No thrill. No extra heart sounds. PULM: CTA B, no wheezes, crackles, rhonchi. No retractions. No resp. distress. No accessory muscle use. ABD: S,  ND, +BS. No rebound. No HSM.  Mild epigastric tenderness noted  EXTR: No c/c/e NEURO Normal gait.  PSYCH: Normally interactive. Conversant. Not depressed or anxious appearing.  Calm demeanor.    Assessment and Plan: Controlled type 2 diabetes mellitus without complication, without long-term current use of insulin ([place]) - Plan: Basic metabolic panel, Hemoglobin A1c  Essential hypertension, benign  Mixed hyperlipidemia  Hypothyroidism (acquired)  Need for influenza vaccination - Plan: Flu vaccine HIGH DOSE PF (Fluzone High dose)  Gastroesophageal reflux disease without esophagitis - Plan: H. pylori breath test, omeprazole (PRILOSEC) 20 MG capsule  Abnormal findings on diagnostic imaging of lung - Plan: CT  Chest Wo Contrast  Here today for a few things Check on on DM labs as above Thyroid is UTD Flu shot given Reassured that DBP in the 70s is quite normal Set up repeat CT scan for her chest  Persistent GERD sx- H pylori today. Start on PPI  Will plan further follow- up pending labs.  Received her labs but she does not have mychart 11/8 Called 11/12-  She reports that her sx are resolved with addition of PPI.  Let her know that H pylori is negative  Results  for orders placed or performed in visit on 23/41/44  Basic metabolic panel  Result Value Ref Range   Sodium 138 135 - 145 mEq/L   Potassium 4.2 3.5 - 5.1 mEq/L   Chloride 100 96 - 112 mEq/L   CO2 32 19 - 32 mEq/L   Glucose, Bld 122 (H) 70 - 99 mg/dL   BUN 21 6 - 23 mg/dL   Creatinine, Ser 1.01 0.40 - 1.20 mg/dL   Calcium 9.4 8.4 - 10.5 mg/dL   GFR 56.63 (L) >60.00 mL/min  Hemoglobin A1c  Result Value Ref Range   Hgb A1c MFr Bld 5.7 4.6 - 6.5 %  H. pylori breath test  Result Value Ref Range   H. pylori Breath Test NOT DETECTED NOT DETECT      Signed Lamar Blinks, MD

## 2018-08-06 ENCOUNTER — Ambulatory Visit (INDEPENDENT_AMBULATORY_CARE_PROVIDER_SITE_OTHER): Payer: Medicare Other | Admitting: Family Medicine

## 2018-08-06 ENCOUNTER — Encounter: Payer: Self-pay | Admitting: Family Medicine

## 2018-08-06 ENCOUNTER — Ambulatory Visit (INDEPENDENT_AMBULATORY_CARE_PROVIDER_SITE_OTHER): Payer: Medicare Other | Admitting: *Deleted

## 2018-08-06 ENCOUNTER — Encounter: Payer: Self-pay | Admitting: *Deleted

## 2018-08-06 VITALS — BP 128/70 | HR 97 | Temp 98.1°F | Resp 18 | Ht 63.0 in | Wt 211.4 lb

## 2018-08-06 VITALS — BP 128/70 | HR 97 | Ht 63.0 in | Wt 211.0 lb

## 2018-08-06 DIAGNOSIS — E039 Hypothyroidism, unspecified: Secondary | ICD-10-CM | POA: Diagnosis not present

## 2018-08-06 DIAGNOSIS — Z780 Asymptomatic menopausal state: Secondary | ICD-10-CM

## 2018-08-06 DIAGNOSIS — Z0000 Encounter for general adult medical examination without abnormal findings: Secondary | ICD-10-CM

## 2018-08-06 DIAGNOSIS — E782 Mixed hyperlipidemia: Secondary | ICD-10-CM | POA: Diagnosis not present

## 2018-08-06 DIAGNOSIS — Z23 Encounter for immunization: Secondary | ICD-10-CM | POA: Diagnosis not present

## 2018-08-06 DIAGNOSIS — R918 Other nonspecific abnormal finding of lung field: Secondary | ICD-10-CM | POA: Diagnosis not present

## 2018-08-06 DIAGNOSIS — I10 Essential (primary) hypertension: Secondary | ICD-10-CM | POA: Diagnosis not present

## 2018-08-06 DIAGNOSIS — K219 Gastro-esophageal reflux disease without esophagitis: Secondary | ICD-10-CM

## 2018-08-06 DIAGNOSIS — E119 Type 2 diabetes mellitus without complications: Secondary | ICD-10-CM

## 2018-08-06 LAB — BASIC METABOLIC PANEL
BUN: 21 mg/dL (ref 6–23)
CALCIUM: 9.4 mg/dL (ref 8.4–10.5)
CO2: 32 meq/L (ref 19–32)
CREATININE: 1.01 mg/dL (ref 0.40–1.20)
Chloride: 100 mEq/L (ref 96–112)
GFR: 56.63 mL/min — ABNORMAL LOW (ref >60.00)
GLUCOSE: 122 mg/dL — AB (ref 70–99)
Potassium: 4.2 mEq/L (ref 3.5–5.1)
Sodium: 138 mEq/L (ref 135–145)

## 2018-08-06 LAB — HEMOGLOBIN A1C: Hgb A1c MFr Bld: 5.7 % (ref 4.6–6.5)

## 2018-08-06 MED ORDER — OMEPRAZOLE 20 MG PO CPDR: 20.0000 mg | DELAYED_RELEASE_CAPSULE | Freq: Every day | ORAL | 5 refills | Status: DC

## 2018-08-06 NOTE — Progress Notes (Addendum)
Subjective:   Sandra Hughes is a 77 y.o. female who presents for an Initial Medicare Annual Wellness Visit.  Pt loves to read and do word puzzles.  Review of Systems    Cardiac Risk Factors include: advanced age (>66men, >63 women);diabetes mellitus;hypertension Sleep patterns: Takes Trazodone and sleeps well using it. Home Safety/Smoke Alarms: Feels safe in home. Smoke alarms in place. Lives with son in 2 story home. Step over tub with bench.  Female:      Mammo-utd       Dexa scan- ordered        [facility]- Recall 06/2020 Eye- utd with yearly exam per pt.    Objective:    Today's Vitals   08/06/18 0908  BP: 128/70  Pulse: 97  SpO2: 97%  Weight: 211 lb (95.7 kg)  Height: 5\' 3"  (1.6 m)   Body mass index is 37.38 kg/m.  Advanced Directives 08/06/2018 07/04/2017  Does Patient Have a Medical Advance Directive? Yes Yes  Type of Paramedic of [place];Living will [place];Living will  Copy of [place] in Chart? No - copy requested -    Current Medications (verified) Outpatient Encounter Medications as of 08/06/2018  Medication Sig  . cycloSPORINE (RESTASIS) 0.05 % ophthalmic emulsion Place 1 drop into both eyes 2 (two) times daily.  . furosemide (LASIX) 40 MG tablet Take 1 tablet (40 mg total) by mouth 2 (two) times daily.  Marland Kitchen glucose blood (ONE TOUCH ULTRA TEST) test strip Check glucose 3 times daily  . levothyroxine (SYNTHROID, LEVOTHROID) 100 MCG tablet Take 1 tablet (100 mcg total) by mouth daily.  Marland Kitchen lovastatin (MEVACOR) 40 MG tablet Take 1 tablet (40 mg total) by mouth daily.  . metFORMIN (GLUCOPHAGE) 500 MG tablet TAKE 1 TABLET BY MOUTH EVERY DAY WITH BREAKFAST  . omeprazole (PRILOSEC) 20 MG capsule Take 1 capsule (20 mg total) by mouth daily.  . potassium chloride (KLOR-CON M10) 10 MEQ tablet Take 1 tablet (10 mEq total) by mouth daily.  . quinapril (ACCUPRIL) 20 MG tablet TAKE 1 TABLET BY MOUTH EVERY DAY IN  THE MORNING  . traZODone (DESYREL) 100 MG tablet Take 0.5 tablets (50 mg total) by mouth at bedtime.   No facility-administered encounter medications on file as of 08/06/2018.     Allergies (verified) Aspirin   History: Past Medical History:  Diagnosis Date  . Allergy   . Anemia   . Anxiety   . Arthritis   . Cataract   . Depression   . Diabetes mellitus without complication ([place])   . GERD (gastroesophageal reflux disease)   . Hyperlipidemia   . Hypertension   . Osteoporosis   . Thyroid disease    Past Surgical History:  Procedure Laterality Date  . ABDOMINAL HYSTERECTOMY     no cancer  . CHOLECYSTECTOMY    . COLONOSCOPY    . POLYPECTOMY    . UPPER GASTROINTESTINAL ENDOSCOPY     Family History  Problem Relation Age of Onset  . Hypertension Mother   . COPD Father   . Colon polyps Father   . Arthritis Sister   . Diabetes Brother   . Cancer Maternal Grandfather        skin cancer  . Heart disease Paternal Grandfather   . Diabetes Sister   . Colon cancer Neg Hx   . Esophageal cancer Neg Hx   . Rectal cancer Neg Hx   . Stomach cancer Neg Hx  Social History   Socioeconomic History  . Marital status: Widowed    Spouse name: Not on file  . Number of children: Not on file  . Years of education: Not on file  . Highest education level: Not on file  Occupational History  . Not on file  Social Needs  . Financial resource strain: Not on file  . Food insecurity:    Worry: Not on file    Inability: Not on file  . Transportation needs:    Medical: Not on file    Non-medical: Not on file  Tobacco Use  . Smoking status: Never Smoker  . Smokeless tobacco: Never Used  Substance and Sexual Activity  . Alcohol use: No  . Drug use: No  . Sexual activity: Not Currently  Lifestyle  . Physical activity:    Days per week: Not on file    Minutes per session: Not on file  . Stress: Not on file  Relationships  . Social connections:    Talks on phone: Not on file     Gets together: Not on file    Attends religious service: Not on file    Active member of club or organization: Not on file    Attends meetings of clubs or organizations: Not on file    Relationship status: Not on file  Other Topics Concern  . Not on file  Social History Narrative  . Not on file    Tobacco Counseling Counseling given: Not Answered   Clinical Intake: Pain : No/denies pain     Activities of Daily Living In your present state of health, do you have any difficulty performing the following activities: 08/06/2018 04/30/2018  Hearing? N N  Vision? N N  Difficulty concentrating or making decisions? N N  Walking or climbing stairs? N N  Dressing or bathing? N N  Doing errands, shopping? Y N  Comment Does not drive -  Preparing Food and eating ? N -  Using the Toilet? N -  In the past six months, have you accidently leaked urine? N -  Do you have problems with loss of bowel control? N -  Managing your Medications? N -  Managing your Finances? N -  Housekeeping or managing your Housekeeping? N -  Some recent data might be hidden     Immunizations and Health Maintenance Immunization History  Administered Date(s) Administered  . Influenza Split 08/23/2012  . Influenza, High Dose Seasonal PF 08/21/2016, 09/04/2017, 08/06/2018  . Influenza,inj,Quad PF,6+ Mos 05/26/2013, 09/28/2014, 12/14/2015  . Pneumococcal Conjugate-13 02/10/2014  . Pneumococcal Polysaccharide-23 04/08/2015  . Td 04/08/2015   There are no preventive care reminders to display for this patient.  Patient Care Team: Copland, Gay Filler, MD as PCP - General (Family Medicine)  Indicate any recent Medical Services you may have received from other than [facility] providers in the past year (date may be approximate).     Assessment:   This is a routine wellness examination for [name]. Physical assessment deferred to PCP.  Hearing/Vision screen  Visual Acuity Screening   Right eye Left eye Both eyes    Without correction:     With correction: 20/25 20/25 20/25   Hearing Screening Comments: Able to hear conversational tones w/o difficulty. No issues reported.    Dietary issues and exercise activities discussed: Current Exercise Habits: The patient does not participate in regular exercise at present, Exercise limited by: None identified Diet (meal preparation, eat out, water intake, caffeinated beverages, dairy products, fruits and  vegetables): in general, an "unhealthy" diet   Goals    . Increase physical activity      Depression Screen PHQ 2/9 Scores 08/06/2018 04/30/2018 12/14/2015 04/08/2015  PHQ - 2 Score 0 0 0 0  Exception Documentation - - Patient refusal -    Fall Risk Fall Risk  08/06/2018 12/14/2015  Falls in the past year? 0 Yes  Number falls in past yr: - 2 or more  Injury with Fall? - Yes  Comment - Big toe left foot  Risk Factor Category  - High Fall Risk  Risk for fall due to : - Impaired balance/gait  Follow up - Falls prevention discussed    Cognitive Function: Ad8 score reviewed for issues:  Issues making decisions:no  Less interest in hobbies / activities:no  Repeats questions, stories (family complaining):no  Trouble using ordinary gadgets (microwave, computer, phone):no  Forgets the month or year: no  Mismanaging finances: no  Remembering appts:no  Daily problems with thinking and/or memory:no Ad8 score is=0         Screening Tests Health Maintenance  Topic Date Due  . HEMOGLOBIN A1C  10/31/2018  . OPHTHALMOLOGY EXAM  04/22/2019  . FOOT EXAM  05/01/2019  . COLONOSCOPY  07/04/2020  . TETANUS/TDAP  04/07/2025  . INFLUENZA VACCINE  Completed  . DEXA SCAN  Completed  . PNA vac Low Risk Adult  Completed     Plan:    Please schedule your next medicare wellness visit with me in 1 yr.  Continue to eat heart healthy diet (full of fruits, vegetables, whole grains, lean protein, water--limit salt, fat, and sugar intake) and increase physical  activity as tolerated.  Bring a copy of your living will and/or healthcare power of attorney to your next office visit.  Please schedule you dexa scan.  I have personally reviewed and noted the following in the patient's chart:   . Medical and social history . Use of alcohol, tobacco or illicit drugs  . Current medications and supplements . Functional ability and status . Nutritional status . Physical activity . Advanced directives . List of other physicians . Hospitalizations, surgeries, and ER visits in previous 12 months . Vitals . Screenings to include cognitive, depression, and falls . Referrals and appointments  In addition, I have reviewed and discussed with patient certain preventive protocols, quality metrics, and best practice recommendations. A written personalized care plan for preventive services as well as general preventive health recommendations were provided to patient.     Shela Nevin, RN   08/06/2018     I have reviewed the above MWE note by Ms. Vevelyn Royals and agree with her documentation Denny Peon MD

## 2018-08-06 NOTE — Patient Instructions (Signed)
Good to see you today!  I will order the repeat CT chest that you need.   We will check some labs for you today Your BP is really fine.  I would not worry at all about your DBP being in the 70s, this is normal We will do an H pylori test today as well to look for a bacteria that can cause ulcers.   If this is positive I will treat you.  However you can certainly use omeprazole as well, once a day, for 2-3 months.  You can continue to use this longer if need be  Take care and let's plan to visit in about 6 months

## 2018-08-06 NOTE — Patient Instructions (Signed)
Please schedule your next medicare wellness visit with me in 1 yr.  Continue to eat heart healthy diet (full of fruits, vegetables, whole grains, lean protein, water--limit salt, fat, and sugar intake) and increase physical activity as tolerated.  Bring a copy of your living will and/or healthcare power of attorney to your next office visit.  Please schedule you dexa scan.   Sandra Hughes , Thank you for taking time to come for your Medicare Wellness Visit. I appreciate your ongoing commitment to your health goals. Please review the following plan we discussed and let me know if I can assist you in the future.   These are the goals we discussed: Goals    . Increase physical activity       This is a list of the screening recommended for you and due dates:  Health Maintenance  Topic Date Due  . Hemoglobin A1C  10/31/2018  . Eye exam for diabetics  04/22/2019  . Complete foot exam   05/01/2019  . Colon Cancer Screening  07/04/2020  . Tetanus Vaccine  04/07/2025  . Flu Shot  Completed  . DEXA scan (bone density measurement)  Completed  . Pneumonia vaccines  Completed    Health Maintenance for Postmenopausal Women Menopause is a normal process in which your reproductive ability comes to an end. This process happens gradually over a span of months to years, usually between the ages of 10 and 48. Menopause is complete when you have missed 12 consecutive menstrual periods. It is important to talk with your health care provider about some of the most common conditions that affect postmenopausal women, such as heart disease, cancer, and bone loss (osteoporosis). Adopting a healthy lifestyle and getting preventive care can help to promote your health and wellness. Those actions can also lower your chances of developing some of these common conditions. What should I know about menopause? During menopause, you may experience a number of symptoms, such as:  Moderate-to-severe hot  flashes.  Night sweats.  Decrease in sex drive.  Mood swings.  Headaches.  Tiredness.  Irritability.  Memory problems.  Insomnia.  Choosing to treat or not to treat menopausal changes is an individual decision that you make with your health care provider. What should I know about hormone replacement therapy and supplements? Hormone therapy products are effective for treating symptoms that are associated with menopause, such as hot flashes and night sweats. Hormone replacement carries certain risks, especially as you become older. If you are thinking about using estrogen or estrogen with progestin treatments, discuss the benefits and risks with your health care provider. What should I know about heart disease and stroke? Heart disease, heart attack, and stroke become more likely as you age. This may be due, in part, to the hormonal changes that your body experiences during menopause. These can affect how your body processes dietary fats, triglycerides, and cholesterol. Heart attack and stroke are both medical emergencies. There are many things that you can do to help prevent heart disease and stroke:  Have your blood pressure checked at least every 1-2 years. High blood pressure causes heart disease and increases the risk of stroke.  If you are 63-46 years old, ask your health care provider if you should take aspirin to prevent a heart attack or a stroke.  Do not use any tobacco products, including cigarettes, chewing tobacco, or electronic cigarettes. If you need help quitting, ask your health care provider.  It is important to eat a healthy diet and  maintain a healthy weight. ? Be sure to include plenty of vegetables, fruits, low-fat dairy products, and lean protein. ? Avoid eating foods that are high in solid fats, added sugars, or salt (sodium).  Get regular exercise. This is one of the most important things that you can do for your health. ? Try to exercise for at least 150  minutes each week. The type of exercise that you do should increase your heart rate and make you sweat. This is known as moderate-intensity exercise. ? Try to do strengthening exercises at least twice each week. Do these in addition to the moderate-intensity exercise.  Know your numbers.Ask your health care provider to check your cholesterol and your blood glucose. Continue to have your blood tested as directed by your health care provider.  What should I know about cancer screening? There are several types of cancer. Take the following steps to reduce your risk and to catch any cancer development as early as possible. Breast Cancer  Practice breast self-awareness. ? This means understanding how your breasts normally appear and feel. ? It also means doing regular breast self-exams. Let your health care provider know about any changes, no matter how small.  If you are 81 or older, have a clinician do a breast exam (clinical breast exam or CBE) every year. Depending on your age, family history, and medical history, it may be recommended that you also have a yearly breast X-ray (mammogram).  If you have a family history of breast cancer, talk with your health care provider about genetic screening.  If you are at high risk for breast cancer, talk with your health care provider about having an MRI and a mammogram every year.  Breast cancer (BRCA) gene test is recommended for women who have family members with BRCA-related cancers. Results of the assessment will determine the need for genetic counseling and BRCA1 and for BRCA2 testing. BRCA-related cancers include these types: ? Breast. This occurs in males or females. ? Ovarian. ? Tubal. This may also be called fallopian tube cancer. ? Cancer of the abdominal or pelvic lining (peritoneal cancer). ? Prostate. ? Pancreatic.  Cervical, Uterine, and Ovarian Cancer Your health care provider may recommend that you be screened regularly for cancer  of the pelvic organs. These include your ovaries, uterus, and vagina. This screening involves a pelvic exam, which includes checking for microscopic changes to the surface of your cervix (Pap test).  For women ages 21-65, health care providers may recommend a pelvic exam and a Pap test every three years. For women ages 52-65, they may recommend the Pap test and pelvic exam, combined with testing for human papilloma virus (HPV), every five years. Some types of HPV increase your risk of cervical cancer. Testing for HPV may also be done on women of any age who have unclear Pap test results.  Other health care providers may not recommend any screening for nonpregnant women who are considered low risk for pelvic cancer and have no symptoms. Ask your health care provider if a screening pelvic exam is right for you.  If you have had past treatment for cervical cancer or a condition that could lead to cancer, you need Pap tests and screening for cancer for at least 20 years after your treatment. If Pap tests have been discontinued for you, your risk factors (such as having a new sexual partner) need to be reassessed to determine if you should start having screenings again. Some women have medical problems that increase the  chance of getting cervical cancer. In these cases, your health care provider may recommend that you have screening and Pap tests more often.  If you have a family history of uterine cancer or ovarian cancer, talk with your health care provider about genetic screening.  If you have vaginal bleeding after reaching menopause, tell your health care provider.  There are currently no reliable tests available to screen for ovarian cancer.  Lung Cancer Lung cancer screening is recommended for adults 73-60 years old who are at high risk for lung cancer because of a history of smoking. A yearly low-dose CT scan of the lungs is recommended if you:  Currently smoke.  Have a history of at least 30  pack-years of smoking and you currently smoke or have quit within the past 15 years. A pack-year is smoking an average of one pack of cigarettes per day for one year.  Yearly screening should:  Continue until it has been 15 years since you quit.  Stop if you develop a health problem that would prevent you from having lung cancer treatment.  Colorectal Cancer  This type of cancer can be detected and can often be prevented.  Routine colorectal cancer screening usually begins at age 24 and continues through age 52.  If you have risk factors for colon cancer, your health care provider may recommend that you be screened at an earlier age.  If you have a family history of colorectal cancer, talk with your health care provider about genetic screening.  Your health care provider may also recommend using home test kits to check for hidden blood in your stool.  A small camera at the end of a tube can be used to examine your colon directly (sigmoidoscopy or colonoscopy). This is done to check for the earliest forms of colorectal cancer.  Direct examination of the colon should be repeated every 5-10 years until age 94. However, if early forms of precancerous polyps or small growths are found or if you have a family history or genetic risk for colorectal cancer, you may need to be screened more often.  Skin Cancer  Check your skin from head to toe regularly.  Monitor any moles. Be sure to tell your health care provider: ? About any new moles or changes in moles, especially if there is a change in a mole's shape or color. ? If you have a mole that is larger than the size of a pencil eraser.  If any of your family members has a history of skin cancer, especially at a young age, talk with your health care provider about genetic screening.  Always use sunscreen. Apply sunscreen liberally and repeatedly throughout the day.  Whenever you are outside, protect yourself by wearing long sleeves, pants,  a wide-brimmed hat, and sunglasses.  What should I know about osteoporosis? Osteoporosis is a condition in which bone destruction happens more quickly than new bone creation. After menopause, you may be at an increased risk for osteoporosis. To help prevent osteoporosis or the bone fractures that can happen because of osteoporosis, the following is recommended:  If you are 30-57 years old, get at least 1,000 mg of calcium and at least 600 mg of vitamin D per day.  If you are older than age 35 but younger than age 43, get at least 1,200 mg of calcium and at least 600 mg of vitamin D per day.  If you are older than age 43, get at least 1,200 mg of calcium and at  least 800 mg of vitamin D per day.  Smoking and excessive alcohol intake increase the risk of osteoporosis. Eat foods that are rich in calcium and vitamin D, and do weight-bearing exercises several times each week as directed by your health care provider. What should I know about how menopause affects my mental health? Depression may occur at any age, but it is more common as you become older. Common symptoms of depression include:  Low or sad mood.  Changes in sleep patterns.  Changes in appetite or eating patterns.  Feeling an overall lack of motivation or enjoyment of activities that you previously enjoyed.  Frequent crying spells.  Talk with your health care provider if you think that you are experiencing depression. What should I know about immunizations? It is important that you get and maintain your immunizations. These include:  Tetanus, diphtheria, and pertussis (Tdap) booster vaccine.  Influenza every year before the flu season begins.  Pneumonia vaccine.  Shingles vaccine.  Your health care provider may also recommend other immunizations. This information is not intended to replace advice given to you by your health care provider. Make sure you discuss any questions you have with your health care  provider. Document Released: 11/08/2005 Document Revised: 04/05/2016 Document Reviewed: 06/20/2015 Elsevier Interactive Patient Education  2018 [employer].

## 2018-08-07 LAB — H. PYLORI BREATH TEST: H. pylori Breath Test: NOT DETECTED

## 2018-08-15 ENCOUNTER — Ambulatory Visit (HOSPITAL_BASED_OUTPATIENT_CLINIC_OR_DEPARTMENT_OTHER)
Admission: RE | Admit: 2018-08-15 | Discharge: 2018-08-15 | Disposition: A | Discharge status: Home / Self Care | Payer: Medicare Other | Source: Ambulatory Visit | Attending: Family Medicine | Admitting: Family Medicine

## 2018-08-15 DIAGNOSIS — Z780 Asymptomatic menopausal state: Secondary | ICD-10-CM | POA: Diagnosis not present

## 2018-08-15 DIAGNOSIS — M8580 Other specified disorders of bone density and structure, unspecified site: Secondary | ICD-10-CM | POA: Diagnosis not present

## 2018-08-15 DIAGNOSIS — I700 Atherosclerosis of aorta: Secondary | ICD-10-CM | POA: Insufficient documentation

## 2018-08-15 DIAGNOSIS — R918 Other nonspecific abnormal finding of lung field: Secondary | ICD-10-CM | POA: Diagnosis not present

## 2018-08-15 DIAGNOSIS — Z13820 Encounter for screening for osteoporosis: Secondary | ICD-10-CM | POA: Diagnosis not present

## 2018-08-15 DIAGNOSIS — M8589 Other specified disorders of bone density and structure, multiple sites: Secondary | ICD-10-CM | POA: Diagnosis not present

## 2018-08-17 ENCOUNTER — Telehealth: Payer: Self-pay | Admitting: Family Medicine

## 2018-08-17 NOTE — Telephone Encounter (Signed)
Called to go over her recent imaging reports, as below However no answer and cannot [facility] Will send her a letter with reports  would like to offer treatment for low bone density to her    Ct Chest Wo Contrast  Result Date: 08/16/2018 CLINICAL DATA:  Followup small bilateral lung nodules. No smoking history. EXAM: CT CHEST WITHOUT CONTRAST TECHNIQUE: Multidetector CT imaging of the chest was performed following the standard protocol without IV contrast. COMPARISON:  11/12/2016 and abdomen CT dated 05/30/2016. FINDINGS: Cardiovascular: Atheromatous calcifications, including the coronary arteries and aorta. Normal sized heart. Mediastinum/Nodes: Calcified AP window lymph nodes. Lungs/Pleura: Elongated nodular density in the left lower lobe measuring 6 mm in maximum diameter on image number 75 series 4, unchanged. 4 mm nodular density in the left lower lobe on image number 95 of series 4, unchanged. 6 mm nodular density in the right lower lobe on image number 103 series 4, unchanged. Stable left upper lobe calcified granuloma. No other lung nodules are seen. No pleural fluid. Upper Abdomen: Multiple small calcified granulomata in the spleen. Musculoskeletal: Thoracic spine degenerative changes and mild scoliosis. Mild lower cervical spine degenerative changes. IMPRESSION: Stable bilateral lung nodules, as described above. The long-term stability is compatible with a benign process. Aortic Atherosclerosis (ICD10-I70.0). Electronically Signed   By: Claudie Revering M.D.   On: 08/16/2018 11:16   Dg Bone Density  Result Date: 08/17/2018 EXAM: DUAL X-RAY ABSORPTIOMETRY (DXA) FOR BONE MINERAL DENSITY IMPRESSION: Wittmann C Kasiya Burck Your patient Sandra Hughes completed a BMD test on 08/15/2018 using the [place] (analysis version: 16.SP2) manufactured by [employer]. The following summarizes the results of our evaluation. PATIENT: Name: Sandra Hughes Patient ID: [id number] Birth Date: June 14, 1941 Height:  64.0 in. Gender: Female Measured: 08/15/2018 Weight: 211.0 lbs. Indications: Caucasian, Diabetic, Family Hist. (Parent hip fracture), Family History of Osteoporosis, Hypothyroidism, Hysterectomy, Low Calcium Intake, Oophorectomy ( Bilateral), Parent hip Fx, Post Menopausal Fractures: Treatments: Furosemide, Levothyroxine, Metformin, Prilosec ASSESSMENT: The BMD measured at AP Spine L1-L4 is 0.930 g/cm2 with a T-score of -2.1. This patient is considered OSTEOPENIC according to [facility]) criteria. Scan quality was good. Site Region Measured Date Measured Age WHO YA BMD Classification T-score AP Spine L1-L4 08/15/2018 76.1 Osteopenia -2.1 0.930 g/cm2 DualFemur Neck Left 08/15/2018 76.1 years Osteopenia -1.9 0.781 g/cm2 World Health Organization [facility]) criteria for post-menopausal, Caucasian Women: Normal       T-score at or above -1 SD Osteopenia   T-score between -1 and -2.5 SD Osteoporosis T-score at or below -2.5 SD RECOMMENDATION: 1. All patients should optimize calcium and vitamin D intake. 2. Consider FDA-approved medical therapies in postmenopausal women and men aged 20 years and older, based on the following: a. A hip or vertebral(clinical or morphometric) fracture. b. T-Score < -2.5 at the femoral neck or spine after appropriate evaluation to exclude secondary causes c. Low bone mass (T-score between -1.0 and -2.5 at the femoral neck or spine) and a 10 year probability of a hip fracture >3% or a 10 year probability of major osteoporosis-related fracture > 20% based on the [place]-adapted WHO algorithm d. Clinical judgement and/or patient preferences may indicate treatment for people with 10-year fracture probabilities above or below these levels FOLLOW-UP: Patients with diagnosis of osteoporosis or at high risk for fracture should have regular bone mineral density tests. For patients eligible for Medicare, routine testing is allowed once every 2 years. The testing frequency can be increased to  one year for patients who  have rapidly progressing disease, those who are receiving or discontinuing medical therapy to restore bone mass, or have additional risk factors. I have reviewed this report and agree with the above findings. Mark A. Thornton Papas, M.D. [facility] Patient: Sandra Hughes Referring Physician: Darreld Mclean Birth Date: 01/16/1941 [age]       [age] Patient ID: [id number] Height: 64.0 in. Weight: 211.0 lbs. Measured: 08/15/2018 11:35:24 AM (16 SP 2) Gender: Female Ethnicity: White Analyzed: 08/15/2018 11:52:58 AM (16 SP 2) FRAX* 10-year Probability of Fracture Based on femoral neck BMD: DualFemur (Left) Major Osteoporotic Fracture: 21.1% Hip Fracture:                11.8% Population:                  [place] (Caucasian) Risk Factors:                Family Hist. (Parent hip fracture) *FRAX is a [place] of [employer] for Metabolic Bone Disease, a [place] (WHO) [facility]. ASSESSMENT: The probability of a major osteoporotic fracture is 21.1% within the next ten years. The probability of a hip fracture is 11.8% within the next ten years. I have reviewed this report and agree with the above findings. Mark A. Thornton Papas, M.D. [facility] Electronically Signed   By: Lavonia Dana M.D.   On: 08/17/2018 08:52

## 2018-08-26 ENCOUNTER — Telehealth: Payer: Self-pay

## 2018-08-26 DIAGNOSIS — M810 Age-related osteoporosis without current pathological fracture: Secondary | ICD-10-CM

## 2018-08-26 MED ORDER — ALENDRONATE SODIUM 70 MG PO TABS: 70.0000 mg | ORAL_TABLET | ORAL | 3 refills | Status: DC

## 2018-08-26 NOTE — Telephone Encounter (Signed)
Copied from [place] [phone]. Topic: General - Other >> Aug 26, 2018  8:38 AM Sandra Hughes wrote: Reason for CRM: pt calling stating that Dr Lorelei Pont wanted to know if pt wanted to start Flosamax and the pt would like to start the medication

## 2018-08-26 NOTE — Telephone Encounter (Signed)
Called her and answered all Q,sent in rx forher

## 2018-08-26 NOTE — Addendum Note (Signed)
Addended by: Lamar Blinks C on: 08/26/2018 05:45 PM   Modules accepted: Orders

## 2018-11-17 ENCOUNTER — Ambulatory Visit: Payer: Self-pay

## 2018-11-17 NOTE — Progress Notes (Addendum)
[facility] [address] [place], [place] [address] [phone]  Date:  11/19/2018   Name:  Sandra Hughes   DOB:  06/28/1941   MRN:  [id number]  PCP:  Darreld Mclean, MD    Chief Complaint: Back Pain (nausea, has been passing kindey stones off and on since january, no known fever) and Edema (bilateral hand and feet swelling, weight gain-19 lbs since 11/19)   History of Present Illness:  Sandra Hughes is a 78 y.o. very pleasant female patient who presents with the following:  Patient with history of diabetes, GERD, hypothyroidism, hypertension, hyperlipidemia. She called in on February 18th, as follows: Pt c/o nausea since started taking Fosamax. Symptoms begin shortly after taking dose. The nausea will go away and then come back after taking another Fosamax. Nausea has been present for 3 months.  Pt c/o mild hand and ankle swelling for 2 weeks. Pt denies pain, or redness or calf pain or difficulty breathing. Pt stated that this is a recurrent symptom "caused by salt intake." Pt stated that the swelling usually resolves on it's own.  Pt c/o lower back pain since January. Pt stated that she passes kidney stones "every day." Pt stated that she has an mild "achy" pain. Pt stated that she has burning when she passes the kidney stones. Pt stated she does not experience the burning with every void. Pt c/o crampy abdominal pain that started Sunday.Pain is intermittent.  Care advice given and pt verbalized understanding. Pt wanted to see Dr. Lorelei Pont today. Offered to see if another provider had an opening sooner, pt refused and wants only to see Dr Lorelei Pont. Appt given for 11/19/18 at 0940.   I previously saw her in November At that time she was doing pretty well, A1c looked fine We repeat a CT to follow-up on lung nodules, and these were unchanged.  No further follow-up was needed She also was noted to have low bone density, and  we started her on Fosamax at the end of November  Wt Readings from Last 3 Encounters:  11/19/18 219 lb (99.3 kg)  08/06/18 211 lb (95.7 kg)  08/06/18 211 lb 6.4 oz (95.9 kg)   Echo 6/17 - Normal LV size with mild LV hypertrophy. EF 55-60%. Normal RV   size and systolic function. No significant valvular   abnormalities. She also did a stress echo in 2017 which was normal   She notes that after she takes Fosamax she will feel nauseated for about 30 minutes This may last for nearly a week, almost until time for her next dose  We can certainly stop the fosamax since it is bothering her  Also, she feels like she is having frequent "kidney stones" She will have a feeling of pressure "in my urethra" and will then have some burning, she will then feel like this pressure is released No blood in her urine She is having some pain in her back for the last 3-4 months  We have never visualized kidney stones, but she states that her doctor in [place] told her that she has "gravel" that causes these sx.  However as far as I know we have never visualized stones, and she never needed any sort of procedure- stent, lithotripsy,etc   She also notes that by her home scale her weight is up by about 15 lbs over the last 4 months and she is not sure why  She has noted some swelling in her feet and hands over the last 2 weeks She is trying to avoid salt She is already using Lasix  She denies any vaginal sx such as itching or pain   Lasix 40 twice daily Levothyroxine 100 mcg Lovastatin 40 Metformin 500 once a day Prilosec Potassium 10 mg once daily Accupril 20 daily Trazodone as needed Fosamax   BP Readings from Last 3 Encounters:  11/19/18 128/82  08/06/18 128/70  08/06/18 128/70    Patient Active Problem List   Diagnosis Date Noted  . Pulmonary nodule 11/21/2016  . HTN (hypertension) 05/15/2012  . Edema 05/15/2012  . Allergic rhinitis 05/15/2012  . Diabetes mellitus type II  05/15/2012  . Osteopenia 05/15/2012  . Depression 05/15/2012  . Hypothyroidism 05/15/2012  . GERD (gastroesophageal reflux disease) 05/15/2012  . Hypercholesterolemia 05/15/2012  . Vitamin D deficiency 05/15/2012    Past Medical History:  Diagnosis Date  . Allergy   . Anemia   . Anxiety   . Arthritis   . Cataract   . Depression   . Diabetes mellitus without complication ([place])   . GERD (gastroesophageal reflux disease)   . Hyperlipidemia   . Hypertension   . Osteoporosis   . Thyroid disease     Past Surgical History:  Procedure Laterality Date  . ABDOMINAL HYSTERECTOMY     no cancer  . CHOLECYSTECTOMY    . COLONOSCOPY    . POLYPECTOMY    . UPPER GASTROINTESTINAL ENDOSCOPY      Social History   Tobacco Use  . Smoking status: Never Smoker  . Smokeless tobacco: Never Used  Substance Use Topics  . Alcohol use: No  . Drug use: No    Family History  Problem Relation Age of Onset  . Hypertension Mother   . COPD Father   . Colon polyps Father   . Arthritis Sister   . Diabetes Brother   . Cancer Maternal Grandfather        skin cancer  . Heart disease Paternal Grandfather   . Diabetes Sister   . Colon cancer Neg Hx   . Esophageal cancer Neg Hx   . Rectal cancer Neg Hx   . Stomach cancer Neg Hx     Allergies  Allergen Reactions  . Aspirin Anaphylaxis    Medication list has been reviewed and updated.  Current Outpatient Medications on File Prior to Visit  Medication Sig Dispense Refill  . cycloSPORINE (RESTASIS) 0.05 % ophthalmic emulsion Place 1 drop into both eyes 2 (two) times daily. 5.5 mL 3  . furosemide (LASIX) 40 MG tablet Take 1 tablet (40 mg total) by mouth 2 (two) times daily. 180 tablet 3  . glucose blood (ONE TOUCH ULTRA TEST) test strip Check glucose 3 times daily 300 each 12  . levothyroxine (SYNTHROID, LEVOTHROID) 100 MCG tablet Take 1 tablet (100 mcg total) by mouth daily. 90 tablet 3  . lovastatin (MEVACOR) 40 MG tablet Take 1 tablet (40  mg total) by mouth daily. 90 tablet 3  . metFORMIN (GLUCOPHAGE) 500 MG tablet TAKE 1 TABLET BY MOUTH EVERY DAY WITH BREAKFAST 90 tablet 3  . omeprazole (PRILOSEC) 20 MG capsule Take 1 capsule (20 mg total) by mouth daily. 30 capsule 5  . potassium chloride (KLOR-CON M10) 10 MEQ tablet Take 1 tablet (10 mEq total) by mouth daily. 90 tablet 3  . quinapril (ACCUPRIL) 20 MG tablet TAKE 1 TABLET BY MOUTH EVERY DAY IN THE MORNING 90 tablet 3  .  traZODone (DESYREL) 100 MG tablet Take 0.5 tablets (50 mg total) by mouth at bedtime. 45 tablet 3  . alendronate (FOSAMAX) 70 MG tablet Take 1 tablet (70 mg total) by mouth every 7 (seven) days. Take with a full glass of water on an empty stomach. (Patient not taking: Reported on 11/19/2018) 12 tablet 3   No current facility-administered medications on file prior to visit.     Review of Systems:  As per HPI- otherwise negative. No fever or chills No chest pain or shortness of breath  Physical Examination: Vitals:   11/19/18 0943  BP: 128/82  Pulse: 78  Resp: 18  Temp: 97.9 F (36.6 C)  SpO2: 98%   Vitals:   11/19/18 0943  Weight: 219 lb (99.3 kg)  Height: 5\' 3"  (1.6 m)   Body mass index is 38.79 kg/m. Ideal Body Weight: Weight in (lb) to have BMI = 25: 140.8  GEN: WDWN, NAD, Non-toxic, A & O x 3, obese, looks well HEENT: Atraumatic, Normocephalic. Neck supple. No masses, No LAD. Ears and Nose: No external deformity. CV: RRR, No M/G/R. No JVD. No thrill. No extra heart sounds. PULM: CTA B, no wheezes, crackles, rhonchi. No retractions. No resp. distress. No accessory muscle use. ABD: S, NT, ND, +BS. No rebound. No HSM.,  Belly is benign No particular tenderness of her back EXTR: No c/c/e NEURO Normal gait.  PSYCH: Normally interactive. Conversant. Not depressed or anxious appearing.  Calm demeanor.    Assessment and Plan: Urinary frequency - Plan: POCT urinalysis dipstick, Urine Culture  Chronic bilateral low back pain without  sciatica - Plan: CT RENAL STONE STUDY  Medication intolerance  Generalized abdominal pain - Plan: CT RENAL STONE STUDY, CBC  Controlled type 2 diabetes mellitus without complication, without long-term current use of insulin (HCC) - Plan: Comprehensive metabolic panel, Hemoglobin A1c  Hypothyroidism (acquired) - Plan: TSH  Weight gain - Plan: TSH  Lower extremity edema - Plan: B Nat Peptide  Here today with a few concerns.  She feels like she is having frequent kidney stones, but was never actually diagnosed with stones.  I suggested that we get a stone study CT today, and she is fine with doing this We will also check a urine culture for UTI She notes weight gain and swelling.  We will check a TSH and BNP A1c to monitor diabetes   Signed Lamar Blinks, MD  Received her labs and CT She is on metformin, GFR is adequate, but we will need to monitor Call patient and went over her labs. No sign of kidney stone Her TSH is high, I will increase her thyroid dose a little bit.  She is currently on 100 mcg, increase to 125 We are just waiting on her urine culture BNP does not show any sign of heart failure   Ct Renal Stone Study  Result Date: 11/19/2018 CLINICAL DATA:  Bilateral flank pain. EXAM: CT ABDOMEN AND PELVIS WITHOUT CONTRAST TECHNIQUE: Multidetector CT imaging of the abdomen and pelvis was performed following the standard protocol without IV contrast. COMPARISON:  05/30/2016 FINDINGS: Lower chest: No acute abnormality. Stable small benign pulmonary nodules of the lung bases Hepatobiliary: No focal liver abnormality is seen. Status post cholecystectomy. No biliary dilatation. Pancreas: Unremarkable. No pancreatic ductal dilatation or surrounding inflammatory changes. Spleen: Normal in size without focal abnormality. Adrenals/Urinary Tract: Adrenal glands are unremarkable. Kidneys are normal, without renal calculi, focal lesion, or hydronephrosis. Bladder is unremarkable.  Stomach/Bowel: Stomach is within normal limits. Appendix  appears normal. No evidence of bowel wall thickening, distention, or inflammatory changes. Vascular/Lymphatic: Calcific atherosclerosis. No enlarged abdominal or pelvic lymph nodes. Reproductive: Status post hysterectomy. Other: No abdominal wall hernia or abnormality. No abdominopelvic ascites. Musculoskeletal: No acute or significant osseous findings. IMPRESSION: 1. No non-contrast CT findings of the abdomen or pelvis to explain flank pain. No evidence of urinary tract calculus or hydronephrosis. 2.  Chronic and incidental findings as detailed above. Electronically Signed   By: Eddie Candle M.D.   On: 11/19/2018 15:17   Your metabolic profile looks okay.  Your kidney filtration rate, GFR, is below normal.  However this is stable from previous Your A1c shows excellent control of your blood sugar BNP does not show any evidence of heart failure Your white blood cell counts are normal, no sign of infection Your red cell counts are slightly high, this is also not unusual for you.  It may be due to mild dehydration.  If you like to donate blood, that is a good way to get rid of extra red cells Your TSH is slightly high, indicating that your thyroid dose is a bit low.  You are currently on levothyroxine 100 mcg.  I sent in a prescription for levothyroxine 125 mg for you Please change to this higher dose, and then come in for a thyroid level in about 1 month.  I will order this lab for you  addmd 2/22 Received her urine culture- positive for UTI  Sensitive to amox, keflex Called and let her know. Will treat with amox  Results for orders placed or performed in visit on 11/19/18  Urine Culture  Result Value Ref Range   MICRO NUMBER: [id number]    SPECIMEN QUALITY: Adequate    Sample Source URINE    STATUS: FINAL    ISOLATE 1: Escherichia coli (A)       Susceptibility   Escherichia coli - URINE CULTURE, REFLEX    AMOX/CLAVULANIC <=2 Sensitive      AMPICILLIN 4 Sensitive     AMPICILLIN/SULBACTAM <=2 Sensitive     CEFAZOLIN* <=4 Not Reportable      * For infections other than uncomplicated UTIcaused by E. coli, K. pneumoniae or P. mirabilis:Cefazolin is resistant if MIC > or = 8 mcg/mL.(Distinguishing susceptible versus intermediatefor isolates with MIC < or = 4 mcg/mL requiresadditional testing.)For uncomplicated UTI caused by E. coli,K. pneumoniae or P. mirabilis: Cefazolin issusceptible if MIC <32 mcg/mL and predictssusceptible to the oral agents cefaclor, cefdinir,cefpodoxime, cefprozil, cefuroxime, cephalexinand loracarbef.    CEFEPIME <=1 Sensitive     CEFTRIAXONE <=1 Sensitive     CIPROFLOXACIN <=0.25 Sensitive     LEVOFLOXACIN <=0.12 Sensitive     ERTAPENEM <=0.5 Sensitive     GENTAMICIN <=1 Sensitive     IMIPENEM <=0.25 Sensitive     NITROFURANTOIN <=16 Sensitive     PIP/TAZO <=4 Sensitive     TOBRAMYCIN <=1 Sensitive     TRIMETH/SULFA* <=20 Sensitive      * For infections other than uncomplicated UTIcaused by E. coli, K. pneumoniae or P. mirabilis:Cefazolin is resistant if MIC > or = 8 mcg/mL.(Distinguishing susceptible versus intermediatefor isolates with MIC < or = 4 mcg/mL requiresadditional testing.)For uncomplicated UTI caused by E. coli,K. pneumoniae or P. mirabilis: Cefazolin issusceptible if MIC <32 mcg/mL and predictssusceptible to the oral agents cefaclor, cefdinir,cefpodoxime, cefprozil, cefuroxime, cephalexinand loracarbef.Legend:S = Susceptible  I = IntermediateR = Resistant  NS = Not susceptible* = Not tested  NR = Not reported**NN = See antimicrobic comments  Comprehensive metabolic panel  Result Value Ref Range   Sodium 142 135 - 145 mEq/L   Potassium 5.1 3.5 - 5.1 mEq/L   Chloride 102 96 - 112 mEq/L   CO2 30 19 - 32 mEq/L   Glucose, Bld 119 (H) 70 - 99 mg/dL   BUN 18 6 - 23 mg/dL   Creatinine, Ser 1.11 0.40 - 1.20 mg/dL   Total Bilirubin 0.4 0.2 - 1.2 mg/dL   Alkaline Phosphatase 75 39 - 117 U/L   AST 14  0 - 37 U/L   ALT 10 0 - 35 U/L   Total Protein 7.7 6.0 - 8.3 g/dL   Albumin 4.4 3.5 - 5.2 g/dL   Calcium 9.8 8.4 - 10.5 mg/dL   GFR 47.74 (L) >60.00 mL/min  Hemoglobin A1c  Result Value Ref Range   Hgb A1c MFr Bld 5.7 4.6 - 6.5 %  B Nat Peptide  Result Value Ref Range   Pro B Natriuretic peptide (BNP) 27.0 0.0 - 100.0 pg/mL  CBC  Result Value Ref Range   WBC 6.3 4.0 - 10.5 K/uL   RBC 5.16 (H) 3.87 - 5.11 Mil/uL   Platelets 305.0 150.0 - 400.0 K/uL   Hemoglobin 15.6 (H) 12.0 - 15.0 g/dL   HCT 46.9 (H) 36.0 - 46.0 %   MCV 90.9 78.0 - 100.0 fl   MCHC 33.2 30.0 - 36.0 g/dL   RDW 14.6 11.5 - 15.5 %  TSH  Result Value Ref Range   TSH 4.88 (H) 0.35 - 4.50 uIU/mL  POCT urinalysis dipstick  Result Value Ref Range   Color, UA yellow yellow   Clarity, UA cloudy (A) clear   Glucose, UA negative negative mg/dL   Bilirubin, UA negative negative   Ketones, POC UA negative negative mg/dL   Spec Grav, UA 1.020 1.010 - 1.025   Blood, UA negative negative   pH, UA 5.0 5.0 - 8.0   Protein Ur, POC negative negative mg/dL   Urobilinogen, UA 0.2 0.2 or 1.0 E.U./dL   Nitrite, UA Negative Negative   Leukocytes, UA Negative Negative

## 2018-11-17 NOTE — Telephone Encounter (Signed)
Pt c/o nausea since started taking Fosamax. Symptoms begin shortly after taking dose. The nausea will go away and then come back after taking another Fosamax. Nausea has been present for 3 months.  Pt c/o mild hand and ankle swelling for 2 weeks. Pt denies pain, or redness or calf pain or difficulty breathing. Pt stated that this is a recurrent symptom "caused by salt intake." Pt stated that the swelling usually resolves on it's own.  Pt c/o lower back pain since January. Pt stated that she passes kidney stones "every day." Pt stated that she has an mild "achy" pain. Pt stated that she has burning when she passes the kidney stones. Pt stated she does not experience the burning with every void. Pt c/o crampy abdominal pain that started Sunday.Pain is intermittent.  Care advice given and pt verbalized understanding. Pt wanted to see Dr. Lorelei Pont today. Offered to see if another provider had an opening sooner, pt refused and wants only to see Dr Lorelei Pont. Appt given for 11/19/18 at 0940.   Reason for Disposition . Caller has URGENT medication question about med that PCP prescribed and triager unable to answer question . [1] MILD swelling (puffiness) of both hands AND [2] not better after 3 days . [1] MILD swelling of both ankles (i.e., pedal edema) AND [2] new onset or worsening . [1] MILD pain (i.e., scale 1-3; does not interfere with normal activities) AND [2] present > 3 days  Answer Assessment - Initial Assessment Questions 1. SYMPTOMS: "Do you have any symptoms?"     Nausea x 3 months and kidney stones since January 2. SEVERITY: If symptoms are present, ask "Are they mild, moderate or severe?"     moderate  Answer Assessment - Initial Assessment Questions 1. ONSET: "When did the swelling start?" (e.g., minutes, hours, days)     Feet: 2 weeks hands : 2 weeks 2. LOCATION: "What part of the hand is swollen?"  "Are both hands swollen or just one hand?"     Fingers both hands 3. SEVERITY: "How bad is  the swelling?" (e.g., localized; mild, moderate, severe)   - BALL OR LUMP: small ball or lump   - LOCALIZED: puffy or swollen area or patch of skin   - JOINT SWELLING: swelling of a joint   - MILD: puffiness or mild swelling of fingers or hand   - MODERATE: fingers and hand are swollen   - SEVERE: swelling of entire hand and up into forearm     mild 4. REDNESS: "Does the swelling look red or infected?"     no 5. PAIN: "Is the swelling painful to touch?" If so, ask: "How painful is it?"   (Scale 1-10; mild, moderate or severe)     no 6. FEVER: "Do you have a fever?" If so, ask: "What is it, how was it measured, and when did it start?"      no 7. CAUSE: "What do you think is causing the hand swelling?" (e.g., heat, insect bite, pregnancy, recent injury)     Pt doesn't know 8. MEDICAL HISTORY: "Do you have a history of heart failure, kidney disease, liver failure, or cancer?"     no 9. RECURRENT SYMPTOM: "Have you had hand swelling before?" If so, ask: "When was the last time?" "What happened that time?"     Yes- after eating salty food- goes away on its own 10. OTHER SYMPTOMS: "Do you have any other symptoms?" (e.g., blurred vision, difficulty breathing, headache)  Feet swelling-  11. PREGNANCY: "Is there any chance you are pregnant?" "When was your last menstrual period?"       n/a  Answer Assessment - Initial Assessment Questions 1. ONSET: "When did the swelling start?" (e.g., minutes, hours, days)     3 weeks ago 2. LOCATION: "What part of the leg is swollen?"  "Are both legs swollen or just one leg?"     Both ankles 3. SEVERITY: "How bad is the swelling?" (e.g., localized; mild, moderate, severe)  - Localized - small area of swelling localized to one leg  - MILD pedal edema - swelling limited to foot and ankle, pitting edema < 1/4 inch (6 mm) deep, rest and elevation eliminate most or all swelling  - MODERATE edema - swelling of lower leg to knee, pitting edema > 1/4 inch (6  mm) deep, rest and elevation only partially reduce swelling  - SEVERE edema - swelling extends above knee, facial or hand swelling present     mild 4. REDNESS: "Does the swelling look red or infected?"     no 5. PAIN: "Is the swelling painful to touch?" If so, ask: "How painful is it?"   (Scale 1-10; mild, moderate or severe)     no 6. FEVER: "Do you have a fever?" If so, ask: "What is it, how was it measured, and when did it start?"      no 7. CAUSE: "What do you think is causing the leg swelling?"     Pt does not know 8. MEDICAL HISTORY: "Do you have a history of heart failure, kidney disease, liver failure, or cancer?"     no 9. RECURRENT SYMPTOM: "Have you had leg swelling before?" If so, ask: "When was the last time?" "What happened that time?"     Yes- 2 months ago-went away on its own 10. OTHER SYMPTOMS: "Do you have any other symptoms?" (e.g., chest pain, difficulty breathing)       no 11. PREGNANCY: "Is there any chance you are pregnant?" "When was your last menstrual period?"       n/a  Answer Assessment - Initial Assessment Questions 1. LOCATION: "Where does it hurt?" (e.g., left, right)     Achy- around waist slightly above 2. ONSET: "When did the pain start?"     January 3. SEVERITY: "How bad is the pain?" (e.g., Scale 1-10; mild, moderate, or severe)   - MILD (1-3): doesn't interfere with normal activities    - MODERATE (4-7): interferes with normal activities or awakens from sleep    - SEVERE (8-10): excruciating pain and patient unable to do normal activities (stays in bed)       Mild 2/10 4. PATTERN: "Does the pain come and go, or is it constant?"      constant 5. CAUSE: "What do you think is causing the pain?"     Kidney stones 6. OTHER SYMPTOMS:  "Do you have any other symptoms?" (e.g., fever, abdominal pain, vomiting, leg weakness, burning with urination, blood in urine)     Abdominal pain on Sunday has a crampy abdominal pain, nausea, burning with urination  stated passed kidney stones 7. PREGNANCY:  "Is there any chance you are pregnant?" "When was your last menstrual period?"     n/a  Protocols used: MEDICATION QUESTION CALL-A-AH, HAND SWELLING-A-AH, LEG SWELLING AND EDEMA-A-AH, FLANK PAIN-A-AH

## 2018-11-19 ENCOUNTER — Ambulatory Visit (HOSPITAL_BASED_OUTPATIENT_CLINIC_OR_DEPARTMENT_OTHER)
Admission: RE | Admit: 2018-11-19 | Discharge: 2018-11-19 | Disposition: A | Discharge status: Home / Self Care | Payer: Medicare Other | Source: Ambulatory Visit | Attending: Family Medicine | Admitting: Family Medicine

## 2018-11-19 ENCOUNTER — Encounter: Payer: Self-pay | Admitting: Family Medicine

## 2018-11-19 ENCOUNTER — Ambulatory Visit (INDEPENDENT_AMBULATORY_CARE_PROVIDER_SITE_OTHER): Payer: Medicare Other | Admitting: Family Medicine

## 2018-11-19 VITALS — BP 128/82 | HR 78 | Temp 97.9°F | Resp 18 | Ht 63.0 in | Wt 219.0 lb

## 2018-11-19 DIAGNOSIS — R600 Localized edema: Secondary | ICD-10-CM

## 2018-11-19 DIAGNOSIS — R1084 Generalized abdominal pain: Secondary | ICD-10-CM | POA: Diagnosis not present

## 2018-11-19 DIAGNOSIS — E119 Type 2 diabetes mellitus without complications: Secondary | ICD-10-CM | POA: Diagnosis not present

## 2018-11-19 DIAGNOSIS — R635 Abnormal weight gain: Secondary | ICD-10-CM | POA: Diagnosis not present

## 2018-11-19 DIAGNOSIS — M545 Low back pain, unspecified: Secondary | ICD-10-CM

## 2018-11-19 DIAGNOSIS — G8929 Other chronic pain: Secondary | ICD-10-CM | POA: Diagnosis not present

## 2018-11-19 DIAGNOSIS — E039 Hypothyroidism, unspecified: Secondary | ICD-10-CM

## 2018-11-19 DIAGNOSIS — Z789 Other specified health status: Secondary | ICD-10-CM

## 2018-11-19 DIAGNOSIS — R109 Unspecified abdominal pain: Secondary | ICD-10-CM | POA: Diagnosis not present

## 2018-11-19 DIAGNOSIS — R350 Frequency of micturition: Secondary | ICD-10-CM | POA: Diagnosis not present

## 2018-11-19 LAB — TSH: TSH: 4.88 u[IU]/mL — ABNORMAL HIGH (ref 0.35–4.50)

## 2018-11-19 LAB — POCT URINALYSIS DIP (MANUAL ENTRY)
Bilirubin, UA: NEGATIVE
Blood, UA: NEGATIVE
GLUCOSE UA: NEGATIVE mg/dL
Ketones, POC UA: NEGATIVE mg/dL
Leukocytes, UA: NEGATIVE
Nitrite, UA: NEGATIVE
Protein Ur, POC: NEGATIVE mg/dL
Spec Grav, UA: 1.02 (ref 1.010–1.025)
UROBILINOGEN UA: 0.2 U/dL
pH, UA: 5 (ref 5.0–8.0)

## 2018-11-19 LAB — CBC
HCT: 46.9 % — ABNORMAL HIGH (ref 36.0–46.0)
Hemoglobin: 15.6 g/dL — ABNORMAL HIGH (ref 12.0–15.0)
MCHC: 33.2 g/dL (ref 30.0–36.0)
MCV: 90.9 fl (ref 78.0–100.0)
PLATELETS: 305 10*3/uL (ref 150.0–400.0)
RBC: 5.16 Mil/uL — ABNORMAL HIGH (ref 3.87–5.11)
RDW: 14.6 % (ref 11.5–15.5)
WBC: 6.3 10*3/uL (ref 4.0–10.5)

## 2018-11-19 LAB — COMPREHENSIVE METABOLIC PANEL
ALT: 10 U/L (ref 0–35)
AST: 14 U/L (ref 0–37)
Albumin: 4.4 g/dL (ref 3.5–5.2)
Alkaline Phosphatase: 75 U/L (ref 39–117)
BUN: 18 mg/dL (ref 6–23)
CO2: 30 mEq/L (ref 19–32)
Calcium: 9.8 mg/dL (ref 8.4–10.5)
Chloride: 102 mEq/L (ref 96–112)
Creatinine, Ser: 1.11 mg/dL (ref 0.40–1.20)
GFR: 47.74 mL/min — ABNORMAL LOW (ref >60.00)
Glucose, Bld: 119 mg/dL — ABNORMAL HIGH (ref 70–99)
Potassium: 5.1 mEq/L (ref 3.5–5.1)
SODIUM: 142 meq/L (ref 135–145)
Total Bilirubin: 0.4 mg/dL (ref 0.2–1.2)
Total Protein: 7.7 g/dL (ref 6.0–8.3)

## 2018-11-19 LAB — BRAIN NATRIURETIC PEPTIDE: Pro B Natriuretic peptide (BNP): 27 pg/mL (ref 0.0–100.0)

## 2018-11-19 LAB — HEMOGLOBIN A1C: Hgb A1c MFr Bld: 5.7 % (ref 4.6–6.5)

## 2018-11-19 MED ORDER — LEVOTHYROXINE SODIUM 125 MCG PO TABS: 125.0000 ug | ORAL_TABLET | Freq: Every day | ORAL | 5 refills | Status: DC

## 2018-11-19 NOTE — Patient Instructions (Addendum)
Your urine dip today looks okay.  However, we will send it for culture to make sure there is no infection Certainly stop taking fosamax as it is causing too many side effects We can discuss other options for your bone density at our visit in May  Please go to the lab- we are going to look for any cause of fluid retention/ weight gain I will also set up a CT scan to look for kidney stones  As of now we don't have any proof of a recent stone or "gravel," so I would like to find out for sure if you actually have stones  I will be in touch with your labs, please let me know if any change or worsening of your symptoms .

## 2018-11-21 LAB — URINE CULTURE
MICRO NUMBER: 221023
SPECIMEN QUALITY: ADEQUATE

## 2018-11-21 MED ORDER — AMOXICILLIN 500 MG PO CAPS: 500.0000 mg | ORAL_CAPSULE | Freq: Two times a day (BID) | ORAL | 0 refills | Status: DC

## 2018-11-21 NOTE — Addendum Note (Signed)
Addended by: Lamar Blinks C on: 11/21/2018 02:29 PM   Modules accepted: Orders

## 2019-02-02 ENCOUNTER — Other Ambulatory Visit: Payer: Self-pay | Admitting: Family Medicine

## 2019-02-02 DIAGNOSIS — K219 Gastro-esophageal reflux disease without esophagitis: Secondary | ICD-10-CM

## 2019-02-02 NOTE — Telephone Encounter (Signed)
Copied from [place] [id number]. Topic: Quick Communication - See Telephone Encounter >> Feb 02, 2019  5:48 PM Loma [place] wrote: CRM for notification. See Telephone encounter for: 02/02/19.omeprazole ([place]) 20 MG capsule [id number] - [place], [place] [address] [phone] (Phone) [phone] (Fax) refill request

## 2019-02-03 NOTE — Telephone Encounter (Signed)
Refill has been sent.  °

## 2019-02-04 ENCOUNTER — Ambulatory Visit: Payer: Medicare Other | Admitting: Family Medicine

## 2019-02-23 NOTE — Progress Notes (Signed)
[place] at [facility] [address] [place], [place] [address] [phone]  Date:  02/24/2019   Name:  Sandra Hughes   DOB:  [birth year]   MRN:  [id number]  PCP:  Darreld Mclean, MD    Chief Complaint: No chief complaint on file.   History of Present Illness:  Sandra Hughes is a 78 y.o. very pleasant female patient who presents with the following:  Virtual visit today due to pandemic Patient location is home Provider location is office Patient identity confirmed with 2 identifiers, she gives consent for virtual visit today  Following up today for her chronic health conditions Last office visit was in February Jan has history of well controlled diabetes, hypothyroidism, hypertension, hyperlipidemia, GERD, osteopenia, edema At her last visit her TSH was slightly high, I increased her Synthroid from 100 to 125 mcg She feels about the same but is not gaining weight any longer, she has lost a few lbs   A1c is under good control, last done in February- UTD  She did have a bout of back pain which is now resolved   She is on lasix for edema in her bilateral feet and ankles, sometimes her hands as well  She is on lasix 40 BID, potassium 10 I wondered if we could decrease her Lasix dose, but she notes that edema continues to be an issue for her Last BMP and CBC in February as well    Lab Results  Component Value Date   TSH 4.88 (H) 11/19/2018    Lab Results  Component Value Date   HGBA1C 5.7 11/19/2018     Patient Active Problem List   Diagnosis Date Noted  . Pulmonary nodule 11/21/2016  . HTN (hypertension) 05/15/2012  . Edema 05/15/2012  . Allergic rhinitis 05/15/2012  . Diabetes mellitus type II 05/15/2012  . Osteopenia 05/15/2012  . Depression 05/15/2012  . Hypothyroidism 05/15/2012  . GERD (gastroesophageal reflux disease) 05/15/2012  . Hypercholesterolemia 05/15/2012  . Vitamin D deficiency 05/15/2012     Past Medical History:  Diagnosis Date  . Allergy   . Anemia   . Anxiety   . Arthritis   . Cataract   . Depression   . Diabetes mellitus without complication ([place])   . GERD (gastroesophageal reflux disease)   . Hyperlipidemia   . Hypertension   . Osteoporosis   . Thyroid disease     Past Surgical History:  Procedure Laterality Date  . ABDOMINAL HYSTERECTOMY     no cancer  . CHOLECYSTECTOMY    . COLONOSCOPY    . POLYPECTOMY    . UPPER GASTROINTESTINAL ENDOSCOPY      Social History   Tobacco Use  . Smoking status: Never Smoker  . Smokeless tobacco: Never Used  Substance Use Topics  . Alcohol use: No  . Drug use: No    Family History  Problem Relation Age of Onset  . Hypertension Mother   . COPD Father   . Colon polyps Father   . Arthritis Sister   . Diabetes Brother   . Cancer Maternal Grandfather        skin cancer  . Heart disease Paternal Grandfather   . Diabetes Sister   . Colon cancer Neg Hx   . Esophageal cancer Neg Hx   . Rectal cancer Neg Hx   . Stomach cancer Neg Hx     Allergies  Allergen Reactions  . Aspirin Anaphylaxis  Medication list has been reviewed and updated.  Current Outpatient Medications on File Prior to Visit  Medication Sig Dispense Refill  . amoxicillin (AMOXIL) 500 MG capsule Take 1 capsule (500 mg total) by mouth 2 (two) times daily. 14 capsule 0  . cycloSPORINE (RESTASIS) 0.05 % ophthalmic emulsion Place 1 drop into both eyes 2 (two) times daily. 5.5 mL 3  . furosemide (LASIX) 40 MG tablet Take 1 tablet (40 mg total) by mouth 2 (two) times daily. 180 tablet 3  . glucose blood (ONE TOUCH ULTRA TEST) test strip Check glucose 3 times daily 300 each 12  . levothyroxine (SYNTHROID, LEVOTHROID) 125 MCG tablet Take 1 tablet (125 mcg total) by mouth daily. 30 tablet 5  . lovastatin (MEVACOR) 40 MG tablet Take 1 tablet (40 mg total) by mouth daily. 90 tablet 3  . metFORMIN (GLUCOPHAGE) 500 MG tablet TAKE 1 TABLET BY MOUTH  EVERY DAY WITH BREAKFAST 90 tablet 3  . omeprazole (PRILOSEC) 20 MG capsule TAKE 1 CAPSULE BY MOUTH EVERY DAY 90 capsule 1  . potassium chloride (KLOR-CON M10) 10 MEQ tablet Take 1 tablet (10 mEq total) by mouth daily. 90 tablet 3  . quinapril (ACCUPRIL) 20 MG tablet TAKE 1 TABLET BY MOUTH EVERY DAY IN THE MORNING 90 tablet 3  . traZODone (DESYREL) 100 MG tablet Take 0.5 tablets (50 mg total) by mouth at bedtime. 45 tablet 3   No current facility-administered medications on file prior to visit.     Review of Systems:  As per HPI- otherwise negative. She has not been sick at all, no fever or cough  Physical Examination: There were no vitals filed for this visit. There were no vitals filed for this visit. There is no height or weight on file to calculate BMI. Ideal Body Weight:    Glucose was 109 after eating today Her BP was 119/79, P 90 Weight 213- she has lost a few lbs  Pt observed over video- she looks well  No cough, wheezing, distress is noted  Wt Readings from Last 3 Encounters:  11/19/18 219 lb (99.3 kg)  08/06/18 211 lb (95.7 kg)  08/06/18 211 lb 6.4 oz (95.9 kg)    Assessment and Plan: Controlled type 2 diabetes mellitus without complication, without long-term current use of insulin (HCC)  Hypothyroidism (acquired) - Plan: TSH  Hypertension, unspecified type - Plan: Basic metabolic panel, CBC  Virtual follow-up visit today.  Blood pressures under good control.  A1c is up-to-date, will not repeat just yet Recent adjustment of thyroid medication, will bring her in for lab visit to check TSH She is on Lasix and potassium, will also do a BMP. Noted mild polycythemia at last CBC, repeat as well  Will need to refill synthroid upon receipt of TSH report  Also reminded that Shingrix with lab letter Signed Lamar Blinks, MD

## 2019-02-23 NOTE — Patient Instructions (Signed)
It was nice to talk with you today I would recommend you have the shingles vaccine at your convenience, this can be given at your drugstore

## 2019-02-24 ENCOUNTER — Other Ambulatory Visit: Payer: Self-pay

## 2019-02-24 ENCOUNTER — Ambulatory Visit (INDEPENDENT_AMBULATORY_CARE_PROVIDER_SITE_OTHER): Payer: Medicare Other | Admitting: Family Medicine

## 2019-02-24 ENCOUNTER — Encounter: Payer: Self-pay | Admitting: Family Medicine

## 2019-02-24 DIAGNOSIS — E119 Type 2 diabetes mellitus without complications: Secondary | ICD-10-CM

## 2019-02-24 DIAGNOSIS — I10 Essential (primary) hypertension: Secondary | ICD-10-CM

## 2019-02-24 DIAGNOSIS — E039 Hypothyroidism, unspecified: Secondary | ICD-10-CM

## 2019-03-08 ENCOUNTER — Encounter: Payer: Self-pay | Admitting: Family Medicine

## 2019-03-08 ENCOUNTER — Other Ambulatory Visit: Payer: Self-pay

## 2019-03-08 ENCOUNTER — Other Ambulatory Visit (INDEPENDENT_AMBULATORY_CARE_PROVIDER_SITE_OTHER): Payer: Medicare Other

## 2019-03-08 DIAGNOSIS — I10 Essential (primary) hypertension: Secondary | ICD-10-CM

## 2019-03-08 DIAGNOSIS — E039 Hypothyroidism, unspecified: Secondary | ICD-10-CM

## 2019-03-08 LAB — CBC
HCT: 43.5 % (ref 36.0–46.0)
Hemoglobin: 14.7 g/dL (ref 12.0–15.0)
MCHC: 33.7 g/dL (ref 30.0–36.0)
MCV: 90 fl (ref 78.0–100.0)
Platelets: 335 10*3/uL (ref 150.0–400.0)
RBC: 4.84 Mil/uL (ref 3.87–5.11)
RDW: 14.3 % (ref 11.5–15.5)
WBC: 6.1 10*3/uL (ref 4.0–10.5)

## 2019-03-08 LAB — BASIC METABOLIC PANEL
BUN: 20 mg/dL (ref 6–23)
CO2: 28 mEq/L (ref 19–32)
Calcium: 9.2 mg/dL (ref 8.4–10.5)
Chloride: 101 mEq/L (ref 96–112)
Creatinine, Ser: 0.97 mg/dL (ref 0.40–1.20)
GFR: 55.73 mL/min — ABNORMAL LOW (ref >60.00)
Glucose, Bld: 98 mg/dL (ref 70–99)
Potassium: 4.1 mEq/L (ref 3.5–5.1)
Sodium: 141 mEq/L (ref 135–145)

## 2019-03-08 LAB — TSH: TSH: 0.48 u[IU]/mL (ref 0.35–4.50)

## 2019-05-03 ENCOUNTER — Other Ambulatory Visit (HOSPITAL_BASED_OUTPATIENT_CLINIC_OR_DEPARTMENT_OTHER): Payer: Self-pay | Admitting: Family Medicine

## 2019-05-03 DIAGNOSIS — Z1231 Encounter for screening mammogram for malignant neoplasm of breast: Secondary | ICD-10-CM

## 2019-05-11 ENCOUNTER — Ambulatory Visit (HOSPITAL_BASED_OUTPATIENT_CLINIC_OR_DEPARTMENT_OTHER)
Admission: RE | Admit: 2019-05-11 | Discharge: 2019-05-11 | Disposition: A | Discharge status: Home / Self Care | Payer: Medicare Other | Source: Ambulatory Visit | Attending: Family Medicine | Admitting: Family Medicine

## 2019-05-11 ENCOUNTER — Encounter (HOSPITAL_BASED_OUTPATIENT_CLINIC_OR_DEPARTMENT_OTHER): Payer: Self-pay

## 2019-05-11 ENCOUNTER — Other Ambulatory Visit: Payer: Self-pay

## 2019-05-11 DIAGNOSIS — Z1231 Encounter for screening mammogram for malignant neoplasm of breast: Secondary | ICD-10-CM

## 2019-06-06 ENCOUNTER — Other Ambulatory Visit: Payer: Self-pay | Admitting: Family Medicine

## 2019-06-06 DIAGNOSIS — E039 Hypothyroidism, unspecified: Secondary | ICD-10-CM

## 2019-06-10 ENCOUNTER — Other Ambulatory Visit: Payer: Self-pay | Admitting: Family Medicine

## 2019-06-10 DIAGNOSIS — I10 Essential (primary) hypertension: Secondary | ICD-10-CM

## 2019-07-12 ENCOUNTER — Telehealth: Payer: Self-pay

## 2019-07-12 NOTE — Telephone Encounter (Signed)
sure

## 2019-07-12 NOTE — Telephone Encounter (Signed)
Copied from [place] [id number]. Topic: General - Other >> Jul 12, 2019 11:35 AM Rainey Pines A wrote: Patient is requesting lab work order on her upcoming appointment on 07/15/2019

## 2019-07-13 NOTE — Progress Notes (Addendum)
[place] at [facility] [address], [place], [place] [address] [phone]  Date:  07/15/2019   Name:  Sandra Hughes   DOB:  [birth year]   MRN:  [id number]  PCP:  Darreld Mclean, MD    Chief Complaint: Diabetes and Flu Vaccine   History of Present Illness:  Sandra Hughes is a 78 y.o. very pleasant female patient who presents with the following:  Here today for medication follow-up and flu shot Sandra Hughes has history of hypertension, well controlled diabetes, hyperlipidemia, hypothyroidism, vitamin D deficiency, osteopenia, edema in her hands and feet She notes chronic back pain but is otherwise doing well She recently cut down to lasix 40 mg just one a day instead of twice a day, and continues to take potassium  Last visit with myself for virtual visit in May.  Eye exam- not yet this year She does have a DDS appt later on today.  She admits to being very nervous about this appt which may be why her BP is up.  She has a broken tooth and is worried about treatment Foot exam due Flu shot- give today A1c due, could also use lipid panel and TSH BMP and A1c completed in June, up-to-date Can suggest Shingrix Pneumonia, tetanus up-to-date DEXA scan 2019 Mammo up-to-date Never smoker  Lab Results  Component Value Date   HGBA1C 5.7 11/19/2018    Patient Active Problem List   Diagnosis Date Noted  . Pulmonary nodule 11/21/2016  . HTN (hypertension) 05/15/2012  . Edema 05/15/2012  . Allergic rhinitis 05/15/2012  . Diabetes mellitus type II 05/15/2012  . Osteopenia 05/15/2012  . Depression 05/15/2012  . Hypothyroidism 05/15/2012  . GERD (gastroesophageal reflux disease) 05/15/2012  . Hypercholesterolemia 05/15/2012  . Vitamin D deficiency 05/15/2012    Past Medical History:  Diagnosis Date  . Allergy   . Anemia   . Anxiety   . Arthritis   . Cataract   . Depression   . Diabetes mellitus without complication  ([place])   . GERD (gastroesophageal reflux disease)   . Hyperlipidemia   . Hypertension   . Osteoporosis   . Thyroid disease     Past Surgical History:  Procedure Laterality Date  . ABDOMINAL HYSTERECTOMY     no cancer  . CHOLECYSTECTOMY    . COLONOSCOPY    . POLYPECTOMY    . UPPER GASTROINTESTINAL ENDOSCOPY      Social History   Tobacco Use  . Smoking status: Never Smoker  . Smokeless tobacco: Never Used  Substance Use Topics  . Alcohol use: No  . Drug use: No    Family History  Problem Relation Age of Onset  . Hypertension Mother   . COPD Father   . Colon polyps Father   . Arthritis Sister   . Diabetes Brother   . Cancer Maternal Grandfather        skin cancer  . Heart disease Paternal Grandfather   . Diabetes Sister   . Colon cancer Neg Hx   . Esophageal cancer Neg Hx   . Rectal cancer Neg Hx   . Stomach cancer Neg Hx     Allergies  Allergen Reactions  . Aspirin Anaphylaxis    Medication list has been reviewed and updated.  Current Outpatient Medications on File Prior to Visit  Medication Sig Dispense Refill  . cycloSPORINE (RESTASIS) 0.05 % ophthalmic emulsion Place 1 drop into both eyes 2 (two) times daily.  5.5 mL 3  . glucose blood (ONE TOUCH ULTRA TEST) test strip Check glucose 3 times daily 300 each 12  . KLOR-CON M10 10 MEQ tablet TAKE 1 TABLET BY MOUTH EVERY DAY 90 tablet 3  . levothyroxine (SYNTHROID) 125 MCG tablet TAKE 1 TABLET BY MOUTH EVERY DAY 90 tablet 1  . lovastatin (MEVACOR) 40 MG tablet TAKE 1 TABLET BY MOUTH EVERY DAY 90 tablet 3  . metFORMIN (GLUCOPHAGE) 500 MG tablet TAKE 1 TABLET BY MOUTH EVERY DAY WITH BREAKFAST 90 tablet 3  . quinapril (ACCUPRIL) 20 MG tablet TAKE 1 TABLET BY MOUTH EVERY DAY IN THE MORNING 90 tablet 3  . traZODone (DESYREL) 100 MG tablet TAKE 1/2 TABLET (50 MG TOTAL) BY MOUTH AT BEDTIME. 45 tablet 3   No current facility-administered medications on file prior to visit.     Review of Systems:  As per HPI-  otherwise negative.   Physical Examination: Vitals:   07/15/19 0844 07/15/19 0859  BP: (!) 142/88 122/80  Pulse: 91   Resp: 18   Temp: (!) 97.1 F (36.2 C)   SpO2: 98%    Vitals:   07/15/19 0844  Weight: 219 lb (99.3 kg)  Height: 5\' 3"  (1.6 m)   Body mass index is 38.79 kg/m. Ideal Body Weight: Weight in (lb) to have BMI = 25: 140.8  GEN: WDWN, NAD, Non-toxic, A & O x 3, obese, looks well HEENT: Atraumatic, Normocephalic. Neck supple. No masses, No LAD. Ears and Nose: No external deformity. CV: RRR, No M/G/R. No JVD. No thrill. No extra heart sounds. PULM: CTA B, no wheezes, crackles, rhonchi. No retractions. No resp. distress. No accessory muscle use. ABD: S, NT, ND, +BS. No rebound. No HSM. EXTR: No c/c/e NEURO Normal gait.  PSYCH: Normally interactive. Conversant. Not depressed or anxious appearing.  Calm demeanor.  Foot exam:  Normal   Assessment and Plan: Controlled type 2 diabetes mellitus without complication, without long-term current use of insulin ([place]) - Plan: Hemoglobin A1c  Hypothyroidism (acquired) - Plan: TSH  Hypertension, unspecified type - Plan: Basic metabolic panel  Edema, unspecified type - Plan: Basic metabolic panel  Hyperlipidemia, unspecified hyperlipidemia type - Plan: Lipid panel  Depression, unspecified depression type  Essential hypertension, benign - Plan: furosemide (LASIX) 40 MG tablet  Gastroesophageal reflux disease without esophagitis - Plan: omeprazole (PRILOSEC) 20 MG capsule  Needs flu shot - Plan: Flu Vaccine QUAD High Dose(Fluad)  Here today for a follow-up visit Labs pending as above, will check on her diabetes control, thyroid, cholesterol Refilled her Lasix, check potassium- Will plan further follow- up pending labs.  She continues to use omeprazole for GERD Sandra Hughes notes that she previously was taking a product called Percogesic as needed for back and leg pain.  This product is no longer available.  However it appears  that the active ingredient was acetaminophen.  We will ask my nurse to call her and suggest that she try Tylenol  Signed Lamar Blinks, MD  Received her, called pt- no answer call back later   Need to reduce dose of Synthroid Suggest change from lovastatin to Crestor Called again 10/16- no answer, LMOM that I am going to adjust synthroid to 100 mcg Letter with further details  Results for orders placed or performed in visit on 07/15/19  Hemoglobin A1c  Result Value Ref Range   Hgb A1c MFr Bld 5.9 4.6 - 6.5 %  Lipid panel  Result Value Ref Range   Cholesterol 222 (H) 0 -  200 mg/dL   Triglycerides 165.0 (H) 0.0 - 149.0 mg/dL   HDL 72.80 >39.00 mg/dL   VLDL 33.0 0.0 - 40.0 mg/dL   LDL Cholesterol 116 (H) 0 - 99 mg/dL   Total CHOL/HDL Ratio 3    NonHDL 149.10   TSH  Result Value Ref Range   TSH 0.18 (L) 0.35 - 4.50 uIU/mL  Basic metabolic panel  Result Value Ref Range   Sodium 140 135 - 145 mEq/L   Potassium 4.3 3.5 - 5.1 mEq/L   Chloride 102 96 - 112 mEq/L   CO2 29 19 - 32 mEq/L   Glucose, Bld 113 (H) 70 - 99 mg/dL   BUN 14 6 - 23 mg/dL   Creatinine, Ser 0.92 0.40 - 1.20 mg/dL   Calcium 9.4 8.4 - 10.5 mg/dL   GFR 59.19 (L) >60.00 mL/min

## 2019-07-13 NOTE — Patient Instructions (Addendum)
It was a pleasure to see you today, as always. Please have your annual eye exam when you can Please also consider having the shingles vaccine given at your drugstore- Shingrix I will be in touch with your labs ASAP  Assuming all is well let's plan to vistit in 6 months

## 2019-07-14 ENCOUNTER — Other Ambulatory Visit: Payer: Self-pay | Admitting: Family Medicine

## 2019-07-14 ENCOUNTER — Other Ambulatory Visit: Payer: Self-pay

## 2019-07-14 DIAGNOSIS — E119 Type 2 diabetes mellitus without complications: Secondary | ICD-10-CM

## 2019-07-14 DIAGNOSIS — F32A Depression, unspecified: Secondary | ICD-10-CM

## 2019-07-14 DIAGNOSIS — E785 Hyperlipidemia, unspecified: Secondary | ICD-10-CM

## 2019-07-14 DIAGNOSIS — I10 Essential (primary) hypertension: Secondary | ICD-10-CM

## 2019-07-14 DIAGNOSIS — F329 Major depressive disorder, single episode, unspecified: Secondary | ICD-10-CM

## 2019-07-15 ENCOUNTER — Encounter: Payer: Self-pay | Admitting: Family Medicine

## 2019-07-15 ENCOUNTER — Other Ambulatory Visit: Payer: Self-pay

## 2019-07-15 ENCOUNTER — Ambulatory Visit (INDEPENDENT_AMBULATORY_CARE_PROVIDER_SITE_OTHER): Payer: Medicare Other | Admitting: Family Medicine

## 2019-07-15 VITALS — BP 122/80 | HR 91 | Temp 97.1°F | Resp 18 | Ht 63.0 in | Wt 219.0 lb

## 2019-07-15 DIAGNOSIS — E785 Hyperlipidemia, unspecified: Secondary | ICD-10-CM | POA: Diagnosis not present

## 2019-07-15 DIAGNOSIS — F32A Depression, unspecified: Secondary | ICD-10-CM

## 2019-07-15 DIAGNOSIS — K219 Gastro-esophageal reflux disease without esophagitis: Secondary | ICD-10-CM

## 2019-07-15 DIAGNOSIS — I10 Essential (primary) hypertension: Secondary | ICD-10-CM | POA: Diagnosis not present

## 2019-07-15 DIAGNOSIS — Z23 Encounter for immunization: Secondary | ICD-10-CM

## 2019-07-15 DIAGNOSIS — E039 Hypothyroidism, unspecified: Secondary | ICD-10-CM

## 2019-07-15 DIAGNOSIS — R609 Edema, unspecified: Secondary | ICD-10-CM

## 2019-07-15 DIAGNOSIS — E119 Type 2 diabetes mellitus without complications: Secondary | ICD-10-CM

## 2019-07-15 DIAGNOSIS — F329 Major depressive disorder, single episode, unspecified: Secondary | ICD-10-CM

## 2019-07-15 LAB — LIPID PANEL
Cholesterol: 222 mg/dL — ABNORMAL HIGH (ref 0–200)
HDL: 72.8 mg/dL (ref >39.00)
LDL Cholesterol: 116 mg/dL — ABNORMAL HIGH (ref 0–99)
NonHDL: 149.1
Total CHOL/HDL Ratio: 3
Triglycerides: 165 mg/dL — ABNORMAL HIGH (ref 0.0–149.0)
VLDL: 33 mg/dL (ref 0.0–40.0)

## 2019-07-15 LAB — BASIC METABOLIC PANEL
BUN: 14 mg/dL (ref 6–23)
CO2: 29 mEq/L (ref 19–32)
Calcium: 9.4 mg/dL (ref 8.4–10.5)
Chloride: 102 mEq/L (ref 96–112)
Creatinine, Ser: 0.92 mg/dL (ref 0.40–1.20)
GFR: 59.19 mL/min — ABNORMAL LOW (ref >60.00)
Glucose, Bld: 113 mg/dL — ABNORMAL HIGH (ref 70–99)
Potassium: 4.3 mEq/L (ref 3.5–5.1)
Sodium: 140 mEq/L (ref 135–145)

## 2019-07-15 LAB — HEMOGLOBIN A1C: Hgb A1c MFr Bld: 5.9 % (ref 4.6–6.5)

## 2019-07-15 LAB — TSH: TSH: 0.18 u[IU]/mL — ABNORMAL LOW (ref 0.35–4.50)

## 2019-07-15 MED ORDER — OMEPRAZOLE 20 MG PO CPDR: DELAYED_RELEASE_CAPSULE | ORAL | 3 refills | Status: DC

## 2019-07-15 MED ORDER — FUROSEMIDE 40 MG PO TABS: 40.0000 mg | ORAL_TABLET | Freq: Every day | ORAL | 3 refills | Status: DC

## 2019-07-16 MED ORDER — LEVOTHYROXINE SODIUM 100 MCG PO TABS: 100.0000 ug | ORAL_TABLET | Freq: Every day | ORAL | 6 refills | Status: DC

## 2019-07-16 NOTE — Addendum Note (Signed)
Addended by: Lamar Blinks C on: 07/16/2019 03:26 PM   Modules accepted: Orders

## 2019-07-30 ENCOUNTER — Telehealth: Payer: Self-pay

## 2019-07-30 MED ORDER — ROSUVASTATIN CALCIUM 20 MG PO TABS: 20.0000 mg | ORAL_TABLET | Freq: Every day | ORAL | 3 refills | Status: DC

## 2019-07-30 NOTE — Telephone Encounter (Signed)
Patient called for advice on letter you sent her regarding the letter that was sent to her. She is ok starting on crestor for her cholesterol and would [facility]. Also she states she is having 3 teeth pulled and would like to know if there are any medications she needs to stop or adjust prior to procedure.

## 2019-07-30 NOTE — Addendum Note (Signed)
Addended by: Lamar Blinks C on: 07/30/2019 01:13 PM   Modules accepted: Orders

## 2019-07-30 NOTE — Telephone Encounter (Signed)
[facility]st[facility]  I don't see any meds that need to be altered for her dental work if you would let her know  Thank you!

## 2019-08-02 NOTE — Telephone Encounter (Signed)
Left detailed message for patient.

## 2019-08-09 ENCOUNTER — Ambulatory Visit: Payer: Medicare Other | Admitting: *Deleted

## 2019-08-25 ENCOUNTER — Other Ambulatory Visit: Payer: Self-pay

## 2019-08-30 ENCOUNTER — Other Ambulatory Visit: Payer: Self-pay

## 2019-08-30 ENCOUNTER — Other Ambulatory Visit (INDEPENDENT_AMBULATORY_CARE_PROVIDER_SITE_OTHER): Payer: Medicare Other

## 2019-08-30 DIAGNOSIS — E039 Hypothyroidism, unspecified: Secondary | ICD-10-CM

## 2019-08-30 LAB — TSH: TSH: 4.54 u[IU]/mL — ABNORMAL HIGH (ref 0.35–4.50)

## 2019-09-01 ENCOUNTER — Encounter: Payer: Self-pay | Admitting: Family Medicine

## 2019-10-29 ENCOUNTER — Ambulatory Visit: Payer: Medicare Other

## 2019-11-04 ENCOUNTER — Ambulatory Visit: Payer: Medicare Other | Attending: Internal Medicine

## 2019-11-04 DIAGNOSIS — Z23 Encounter for immunization: Secondary | ICD-10-CM | POA: Insufficient documentation

## 2019-11-04 NOTE — Progress Notes (Signed)
   Covid-19 Vaccination Clinic  Name:  Sandra Hughes    MRN: [id number] DOB: [birth year]  11/04/2019  [name] was observed post Covid-19 immunization for 15 minutes without incidence. She was provided with Vaccine Information Sheet and instruction to access the V-Safe system.   [name] was instructed to call 911 with any severe reactions post vaccine: [place] Difficulty breathing  . Swelling of your face and throat  . A fast heartbeat  . A bad rash all over your body  . Dizziness and weakness    Immunizations Administered    Name Date Dose VIS Date Route   Pfizer COVID-19 Vaccine 11/04/2019  1:31 PM 0.3 mL 09/10/2019 Intramuscular   Manufacturer: [place]   Lot: [id number]   [place]: [id number]

## 2019-11-09 ENCOUNTER — Ambulatory Visit: Payer: Medicare Other

## 2019-11-29 ENCOUNTER — Ambulatory Visit: Payer: Medicare Other | Attending: Internal Medicine

## 2019-11-29 DIAGNOSIS — Z23 Encounter for immunization: Secondary | ICD-10-CM

## 2019-11-29 NOTE — Progress Notes (Signed)
   Covid-19 Vaccination Clinic  Name:  Sandra Hughes    MRN: [id number] DOB: 09/16/1941  11/29/2019  Sandra Hughes was observed post Covid-19 immunization for 30 minutes based on pre-vaccination screening without incidence. She was provided with Vaccine Information Sheet and instruction to access the V-Safe system.   Sandra Hughes was instructed to call 911 with any severe reactions post vaccine: Marland Kitchen Difficulty breathing  . Swelling of your face and throat  . A fast heartbeat  . A bad rash all over your body  . Dizziness and weakness    Immunizations Administered    Name Date Dose VIS Date Route   Pfizer COVID-19 Vaccine 11/29/2019  4:45 PM 0.3 mL 09/10/2019 Intramuscular   Manufacturer: [place]   Lot: [id number]   [place]: [id number]

## 2020-01-30 NOTE — Patient Instructions (Addendum)
It was great to see you again today, I will be in touch with your labs as soon as possible  Assuming everything looks as expected, we can plan to visit in 6 months  Please go to [facility] on [address] at your convenience, and we will get x-rays of your back.  I will be in touch with this report.  I suspect she may have spinal stenosis, which is when the space for the spinal column is compromised by arthritis in the back.  This can cause chronic pain and also an urge to lean forward.  We will try gabapentin for you, start with 100 mg at bedtime for couple of days.  Then take 100 mg morning and evening for couple of days, and then can go to 3 times a day.  Please let me know if this helps you-we can increase the dose if needed  [address], [place] [address]   Health Maintenance After Age 4 After age 66, you are at a higher risk for certain long-term diseases and infections as well as injuries from falls. Falls are a major cause of broken bones and head injuries in people who are older than age 48. Getting regular preventive care can help to keep you healthy and well. Preventive care includes getting regular testing and making lifestyle changes as recommended by your health care provider. Talk with your health care provider about:  Which screenings and tests you should have. A screening is a test that checks for a disease when you have no symptoms.  A diet and exercise plan that is right for you. What should I know about screenings and tests to prevent falls? Screening and testing are the best ways to find a health problem early. Early diagnosis and treatment give you the best chance of managing medical conditions that are common after age 23. Certain conditions and lifestyle choices may make you more likely to have a fall. Your health care provider may recommend:  Regular vision checks. Poor vision and conditions such as cataracts can make you more likely to have a fall. If  you wear glasses, make sure to get your prescription updated if your vision changes.  Medicine review. Work with your health care provider to regularly review all of the medicines you are taking, including over-the-counter medicines. Ask your health care provider about any side effects that may make you more likely to have a fall. Tell your health care provider if any medicines that you take make you feel dizzy or sleepy.  Osteoporosis screening. Osteoporosis is a condition that causes the bones to get weaker. This can make the bones weak and cause them to break more easily.  Blood pressure screening. Blood pressure changes and medicines to control blood pressure can make you feel dizzy.  Strength and balance checks. Your health care provider may recommend certain tests to check your strength and balance while standing, walking, or changing positions.  Foot health exam. Foot pain and numbness, as well as not wearing proper footwear, can make you more likely to have a fall.  Depression screening. You may be more likely to have a fall if you have a fear of falling, feel emotionally low, or feel unable to do activities that you used to do.  Alcohol use screening. Using too much alcohol can affect your balance and may make you more likely to have a fall. What actions can I take to lower my risk of falls? General instructions  Talk with your  health care provider about your risks for falling. Tell your health care provider if: ? You fall. Be sure to tell your health care provider about all falls, even ones that seem minor. ? You feel dizzy, sleepy, or off-balance.  Take over-the-counter and prescription medicines only as told by your health care provider. These include any supplements.  Eat a healthy diet and maintain a healthy weight. A healthy diet includes low-fat dairy products, low-fat (lean) meats, and fiber from whole grains, beans, and lots of fruits and vegetables. Home safety  Remove  any tripping hazards, such as rugs, cords, and clutter.  Install safety equipment such as grab bars in bathrooms and safety rails on stairs.  Keep rooms and walkways well-lit. Activity   Follow a regular exercise program to stay fit. This will help you maintain your balance. Ask your health care provider what types of exercise are appropriate for you.  If you need a cane or walker, use it as recommended by your health care provider.  Wear supportive shoes that have nonskid soles. Lifestyle  Do not drink alcohol if your health care provider tells you not to drink.  If you drink alcohol, limit how much you have: ? 0-1 drink a day for women. ? 0-2 drinks a day for men.  Be aware of how much alcohol is in your drink. In the [place] one drink equals one typical bottle of beer (12 oz), one-half glass of wine (5 oz), or one shot of hard liquor (1 oz).  Do not use any products that contain nicotine or tobacco, such as cigarettes and e-cigarettes. If you need help quitting, ask your health care provider. Summary  Having a healthy lifestyle and getting preventive care can help to protect your health and wellness after age 11.  Screening and testing are the best way to find a health problem early and help you avoid having a fall. Early diagnosis and treatment give you the best chance for managing medical conditions that are more common for people who are older than age 47.  Falls are a major cause of broken bones and head injuries in people who are older than age 6. Take precautions to prevent a fall at home.  Work with your health care provider to learn what changes you can make to improve your health and wellness and to prevent falls. This information is not intended to replace advice given to you by your health care provider. Make sure you discuss any questions you have with your health care provider. Document Revised: 01/07/2019 Document Reviewed: 07/30/2017 Elsevier Patient Education   2020 [employer].

## 2020-01-30 NOTE — Progress Notes (Addendum)
[facility] [place], [place], [place] [address] [phone]  Date:  01/31/2020   Name:  Sandra Hughes   DOB:  08/18/1941   MRN:  [id number]  PCP:  Darreld Mclean, MD    Chief Complaint: Diabetes (medication check)   History of Present Illness:  Sandra Hughes is a 79 y.o. very pleasant female patient who presents with the following:  Patient who is well-known to me, here today for a periodic follow-up visit Last seen by myself in October 2020 History of diabetes, hypothyroidism, hypertension, osteopenia, hyperlipidemia, vitamin D deficiency, depression, chronic back pain, edema for which she uses furosemide She had 4 teeth pulled since our last visit- she is waiting on her partial implant  She is getting cleaning every 3-4 months to improved her gum health   Her last labs we adjusted her Synthroid, change from lovastatin to Crestor We will recheck labs for these concerns today  Lab Results  Component Value Date   TSH 4.54 (H) 08/30/2019   Eye exam; she plans to do this month COVID-19 vaccine-complete Needs TSH, A1c, lipids, c-Met, CBC Shingrix- she has not done so yet, suggested that she get this done at her pharmacy It looks like she is due for 3-year colon cancer screening this fall- GI should contact her but I reminded her to watch out for this notice  She is having trouble with back pain, which has been a chronic issue for a number of years.  Gradual getting worse.Marland Kitchen  "I can't straighten up," trying to keep her back straight causes leg and lower back pain She notes some burning in her right hip and buttock area The pain runs across her lower back It does not really go down her legs except occasionally into her right thigh She may get some numbness in her right thigh, she is using emu oil No weakness in her legs, no bowel or bladder control issues She does like to lean on a cart when shopping  suggesting spinal stenosis  She is using advil prn for her back pain- she tries to limit her dose due to concern of side effects  Lasix Potassium Synthroid Metformin Omeprazole Quinapril Crestor Trazodone Patient Active Problem List   Diagnosis Date Noted  . Pulmonary nodule 11/21/2016  . HTN (hypertension) 05/15/2012  . Edema 05/15/2012  . Allergic rhinitis 05/15/2012  . Diabetes mellitus type II 05/15/2012  . Osteopenia 05/15/2012  . Depression 05/15/2012  . Hypothyroidism 05/15/2012  . GERD (gastroesophageal reflux disease) 05/15/2012  . Hypercholesterolemia 05/15/2012  . Vitamin D deficiency 05/15/2012    Past Medical History:  Diagnosis Date  . Allergy   . Anemia   . Anxiety   . Arthritis   . Cataract   . Depression   . Diabetes mellitus without complication ([facility])   . GERD (gastroesophageal reflux disease)   . Hyperlipidemia   . Hypertension   . Osteoporosis   . Thyroid disease     Past Surgical History:  Procedure Laterality Date  . ABDOMINAL HYSTERECTOMY     no cancer  . CHOLECYSTECTOMY    . COLONOSCOPY    . POLYPECTOMY    . UPPER GASTROINTESTINAL ENDOSCOPY      Social History   Tobacco Use  . Smoking status: Never Smoker  . Smokeless tobacco: Never Used  Substance Use Topics  . Alcohol use: No  . Drug use: No    Family History  Problem Relation Age of Onset  . Hypertension Mother   . COPD Father   . Colon polyps Father   . Arthritis Sister   . Diabetes Brother   . Cancer Maternal Grandfather        skin cancer  . Heart disease Paternal Grandfather   . Diabetes Sister   . Colon cancer Neg Hx   . Esophageal cancer Neg Hx   . Rectal cancer Neg Hx   . Stomach cancer Neg Hx     Allergies  Allergen Reactions  . Aspirin Anaphylaxis    Medication list has been reviewed and updated.  Current Outpatient Medications on File Prior to Visit  Medication Sig Dispense Refill  . cycloSPORINE (RESTASIS) 0.05 % ophthalmic emulsion Place 1  drop into both eyes 2 (two) times daily. 5.5 mL 3  . furosemide (LASIX) 40 MG tablet Take 1 tablet (40 mg total) by mouth daily. 90 tablet 3  . glucose blood (ONE TOUCH ULTRA TEST) test strip Check glucose 3 times daily 300 each 12  . KLOR-CON M10 10 MEQ tablet TAKE 1 TABLET BY MOUTH EVERY DAY 90 tablet 3  . levothyroxine (SYNTHROID) 100 MCG tablet Take 1 tablet (100 mcg total) by mouth daily. 30 tablet 6  . metFORMIN (GLUCOPHAGE) 500 MG tablet TAKE 1 TABLET BY MOUTH EVERY DAY WITH BREAKFAST 90 tablet 3  . omeprazole (PRILOSEC) 20 MG capsule TAKE 1 CAPSULE BY MOUTH EVERY DAY 90 capsule 3  . quinapril (ACCUPRIL) 20 MG tablet TAKE 1 TABLET BY MOUTH EVERY DAY IN THE MORNING 90 tablet 3  . rosuvastatin (CRESTOR) 20 MG tablet Take 1 tablet (20 mg total) by mouth daily. 90 tablet 3  . traZODone (DESYREL) 100 MG tablet TAKE 1/2 TABLET (50 MG TOTAL) BY MOUTH AT BEDTIME. 45 tablet 3   No current facility-administered medications on file prior to visit.    Review of Systems:  As per HPI- otherwise negative.   Physical Examination: Vitals:   01/31/20 0939  BP: 114/79  Pulse: 83  Resp: 18  Temp: (!) 97.5 F (36.4 C)  SpO2: 94%   Vitals:   01/31/20 0939  Weight: 215 lb (97.5 kg)  Height: 5' 3" (1.6 m)   Body mass index is 38.09 kg/m. Ideal Body Weight: Weight in (lb) to have BMI = 25: 140.8  GEN: no acute distress.  Obese, otherwise looks well HEENT: Atraumatic, Normocephalic.  Ears and Nose: No external deformity. CV: RRR, No M/G/R. No JVD. No thrill. No extra heart sounds. PULM: CTA B, no wheezes, crackles, rhonchi. No retractions. No resp. distress. No accessory muscle use. ABD: S, NT, ND EXTR: No c/c/e PSYCH: Normally interactive. Conversant.  Gait is somewhat slow, she does use a cane.  It takes her a little bit longer than normal to get to standing.  Thoracolumbar flexion extension is normal.  Normal strength, sensation, DTR both lower extremities.  Negative straight leg raise.   No saddle anesthesia   Assessment and Plan: Controlled type 2 diabetes mellitus without complication, without long-term current use of insulin (HCC) - Plan: Hemoglobin A1c, Microalbumin / creatinine urine ratio  Hypothyroidism (acquired) - Plan: TSH  Hypertension, unspecified type - Plan: CBC, Comprehensive metabolic panel  Edema, unspecified type  Hyperlipidemia, unspecified hyperlipidemia type - Plan: Lipid panel  Chronic bilateral low back pain with right-sided sciatica - Plan: gabapentin (NEURONTIN) 100 MG capsule, DG Lumbar Spine Complete, CANCELED: DG Lumbar Spine Complete  Here today for follow-up visit.  Routine labs pending as   above, I will be in touch with her with these reports.  We did recently adjust her statin and thyroid medication Blood pressure is well controlled She notes some worsening of her chronic back pain, her history is suspicious for spinal stenosis.  Will obtain plain films at her convenience at [facility].  Depending these results, may plan to pursue MRI.  Discussed gabapentin, she would like to try this.  We will have her taper up to 100 mg 3 times daily gradually.  She will let me know if this is helpful  This visit occurred during the SARS-CoV-2 public health emergency.  Safety protocols were in place, including screening questions prior to the visit, additional usage of staff PPE, and extensive cleaning of exam room while observing appropriate contact time as indicated for disinfecting solutions.   Moderate medical decision making today Signed Lamar Blinks, MD  Addendum 5/4, received labs as below.  Message to patient Lipids are improved A1c stable TSH still too high, will adjust Synthroid from 100-125 Results for orders placed or performed in visit on 01/31/20  CBC  Result Value Ref Range   WBC 6.8 4.0 - 10.5 K/uL   RBC 4.93 3.87 - 5.11 Mil/uL   Platelets 298.0 150.0 - 400.0 K/uL   Hemoglobin 14.9 12.0 - 15.0 g/dL   HCT 45.0 36.0 -  46.0 %   MCV 91.4 78.0 - 100.0 fl   MCHC 33.1 30.0 - 36.0 g/dL   RDW 14.9 11.5 - 15.5 %  Comprehensive metabolic panel  Result Value Ref Range   Sodium 140 135 - 145 mEq/L   Potassium 4.1 3.5 - 5.1 mEq/L   Chloride 102 96 - 112 mEq/L   CO2 28 19 - 32 mEq/L   Glucose, Bld 140 (H) 70 - 99 mg/dL   BUN 13 6 - 23 mg/dL   Creatinine, Ser 1.05 0.40 - 1.20 mg/dL   Total Bilirubin 0.5 0.2 - 1.2 mg/dL   Alkaline Phosphatase 79 39 - 117 U/L   AST 20 0 - 37 U/L   ALT 14 0 - 35 U/L   Total Protein 7.5 6.0 - 8.3 g/dL   Albumin 4.4 3.5 - 5.2 g/dL   GFR 50.74 (L) >60.00 mL/min   Calcium 9.4 8.4 - 10.5 mg/dL  Hemoglobin A1c  Result Value Ref Range   Hgb A1c MFr Bld 5.8 4.6 - 6.5 %  Lipid panel  Result Value Ref Range   Cholesterol 160 0 - 200 mg/dL   Triglycerides 149.0 0.0 - 149.0 mg/dL   HDL 78.00 >39.00 mg/dL   VLDL 29.8 0.0 - 40.0 mg/dL   LDL Cholesterol 52 0 - 99 mg/dL   Total CHOL/HDL Ratio 2    NonHDL 82.23   TSH  Result Value Ref Range   TSH 5.45 (H) 0.35 - 4.50 uIU/mL  Microalbumin / creatinine urine ratio  Result Value Ref Range   Microalb, Ur <0.7 0.0 - 1.9 mg/dL   Creatinine,U 36.1 mg/dL   Microalb Creat Ratio 1.9 0.0 - 30.0 mg/g

## 2020-01-31 ENCOUNTER — Encounter: Payer: Self-pay | Admitting: Family Medicine

## 2020-01-31 ENCOUNTER — Ambulatory Visit (INDEPENDENT_AMBULATORY_CARE_PROVIDER_SITE_OTHER): Payer: Medicare Other | Admitting: Family Medicine

## 2020-01-31 ENCOUNTER — Other Ambulatory Visit: Payer: Self-pay

## 2020-01-31 VITALS — BP 114/79 | HR 83 | Temp 97.5°F | Resp 18 | Ht 63.0 in | Wt 215.0 lb

## 2020-01-31 DIAGNOSIS — M5441 Lumbago with sciatica, right side: Secondary | ICD-10-CM

## 2020-01-31 DIAGNOSIS — G8929 Other chronic pain: Secondary | ICD-10-CM | POA: Diagnosis not present

## 2020-01-31 DIAGNOSIS — E119 Type 2 diabetes mellitus without complications: Secondary | ICD-10-CM

## 2020-01-31 DIAGNOSIS — E785 Hyperlipidemia, unspecified: Secondary | ICD-10-CM | POA: Diagnosis not present

## 2020-01-31 DIAGNOSIS — R609 Edema, unspecified: Secondary | ICD-10-CM

## 2020-01-31 DIAGNOSIS — E039 Hypothyroidism, unspecified: Secondary | ICD-10-CM

## 2020-01-31 DIAGNOSIS — I10 Essential (primary) hypertension: Secondary | ICD-10-CM | POA: Diagnosis not present

## 2020-01-31 LAB — COMPREHENSIVE METABOLIC PANEL
ALT: 14 U/L (ref 0–35)
AST: 20 U/L (ref 0–37)
Albumin: 4.4 g/dL (ref 3.5–5.2)
Alkaline Phosphatase: 79 U/L (ref 39–117)
BUN: 13 mg/dL (ref 6–23)
CO2: 28 mEq/L (ref 19–32)
Calcium: 9.4 mg/dL (ref 8.4–10.5)
Chloride: 102 mEq/L (ref 96–112)
Creatinine, Ser: 1.05 mg/dL (ref 0.40–1.20)
GFR: 50.74 mL/min — ABNORMAL LOW (ref >60.00)
Glucose, Bld: 140 mg/dL — ABNORMAL HIGH (ref 70–99)
Potassium: 4.1 mEq/L (ref 3.5–5.1)
Sodium: 140 mEq/L (ref 135–145)
Total Bilirubin: 0.5 mg/dL (ref 0.2–1.2)
Total Protein: 7.5 g/dL (ref 6.0–8.3)

## 2020-01-31 LAB — LIPID PANEL
Cholesterol: 160 mg/dL (ref 0–200)
HDL: 78 mg/dL (ref >39.00)
LDL Cholesterol: 52 mg/dL (ref 0–99)
NonHDL: 82.23
Total CHOL/HDL Ratio: 2
Triglycerides: 149 mg/dL (ref 0.0–149.0)
VLDL: 29.8 mg/dL (ref 0.0–40.0)

## 2020-01-31 LAB — CBC
HCT: 45 % (ref 36.0–46.0)
Hemoglobin: 14.9 g/dL (ref 12.0–15.0)
MCHC: 33.1 g/dL (ref 30.0–36.0)
MCV: 91.4 fl (ref 78.0–100.0)
Platelets: 298 10*3/uL (ref 150.0–400.0)
RBC: 4.93 Mil/uL (ref 3.87–5.11)
RDW: 14.9 % (ref 11.5–15.5)
WBC: 6.8 10*3/uL (ref 4.0–10.5)

## 2020-01-31 LAB — MICROALBUMIN / CREATININE URINE RATIO
Creatinine,U: 36.1 mg/dL
Microalb Creat Ratio: 1.9 mg/g (ref 0.0–30.0)
Microalb, Ur: <0.7 mg/dL (ref 0.0–1.9)

## 2020-01-31 LAB — HEMOGLOBIN A1C: Hgb A1c MFr Bld: 5.8 % (ref 4.6–6.5)

## 2020-01-31 LAB — TSH: TSH: 5.45 u[IU]/mL — ABNORMAL HIGH (ref 0.35–4.50)

## 2020-01-31 MED ORDER — GABAPENTIN 100 MG PO CAPS: 100.0000 mg | ORAL_CAPSULE | Freq: Three times a day (TID) | ORAL | 3 refills | Status: DC

## 2020-02-01 ENCOUNTER — Encounter: Payer: Self-pay | Admitting: Family Medicine

## 2020-02-01 MED ORDER — LEVOTHYROXINE SODIUM 125 MCG PO TABS: 125.0000 ug | ORAL_TABLET | Freq: Every day | ORAL | 6 refills | Status: DC

## 2020-02-01 NOTE — Addendum Note (Signed)
Addended by: Lamar Blinks C on: 02/01/2020 03:20 PM   Modules accepted: Orders

## 2020-02-15 ENCOUNTER — Telehealth: Payer: Self-pay | Admitting: Family Medicine

## 2020-02-15 ENCOUNTER — Encounter: Payer: Self-pay | Admitting: Family Medicine

## 2020-02-15 NOTE — Telephone Encounter (Signed)
   gabapentin (NEURONTIN) 100 MG capsule [id number]    Patient states that she is taking medication three times a day .

## 2020-02-15 NOTE — Telephone Encounter (Signed)
Caller Sandra Hughes  Call Back # [phone]  Subject : gabapentin (NEURONTIN) 100 MG capsule   Patient would like to know if she will take this medication for the rest of her live for back pain? Patient is requesting a call back . Patient is also wondering how many time a day she should take medication.

## 2020-02-16 NOTE — Telephone Encounter (Signed)
Called her back- she does feel like gabapentin is helping her and would like to continue to use it.  She does not want to increase the dose.  This is fine, continue 100 TID

## 2020-02-25 ENCOUNTER — Other Ambulatory Visit: Payer: Self-pay

## 2020-02-25 ENCOUNTER — Ambulatory Visit
Admission: RE | Admit: 2020-02-25 | Discharge: 2020-02-25 | Disposition: A | Discharge status: Home / Self Care | Payer: Medicare Other | Source: Ambulatory Visit | Attending: Family Medicine | Admitting: Family Medicine

## 2020-02-25 DIAGNOSIS — M549 Dorsalgia, unspecified: Secondary | ICD-10-CM | POA: Diagnosis not present

## 2020-02-25 DIAGNOSIS — G8929 Other chronic pain: Secondary | ICD-10-CM

## 2020-02-26 ENCOUNTER — Encounter: Payer: Self-pay | Admitting: Family Medicine

## 2020-03-29 DIAGNOSIS — D3132 Benign neoplasm of left choroid: Secondary | ICD-10-CM | POA: Diagnosis not present

## 2020-03-29 DIAGNOSIS — E119 Type 2 diabetes mellitus without complications: Secondary | ICD-10-CM | POA: Diagnosis not present

## 2020-03-29 DIAGNOSIS — H2513 Age-related nuclear cataract, bilateral: Secondary | ICD-10-CM | POA: Diagnosis not present

## 2020-03-29 LAB — HM DIABETES EYE EXAM

## 2020-05-03 ENCOUNTER — Encounter: Payer: Self-pay | Admitting: Family Medicine

## 2020-05-29 ENCOUNTER — Other Ambulatory Visit: Payer: Self-pay | Admitting: Family Medicine

## 2020-05-29 DIAGNOSIS — I10 Essential (primary) hypertension: Secondary | ICD-10-CM

## 2020-06-08 ENCOUNTER — Other Ambulatory Visit: Payer: Self-pay | Admitting: Family Medicine

## 2020-06-08 DIAGNOSIS — Z1231 Encounter for screening mammogram for malignant neoplasm of breast: Secondary | ICD-10-CM

## 2020-06-13 ENCOUNTER — Other Ambulatory Visit: Payer: Self-pay

## 2020-06-13 ENCOUNTER — Ambulatory Visit
Admission: RE | Admit: 2020-06-13 | Discharge: 2020-06-13 | Disposition: A | Discharge status: Home / Self Care | Payer: Medicare Other | Source: Ambulatory Visit | Attending: Family Medicine | Admitting: Family Medicine

## 2020-06-13 DIAGNOSIS — Z1231 Encounter for screening mammogram for malignant neoplasm of breast: Secondary | ICD-10-CM

## 2020-06-13 NOTE — Patient Instructions (Addendum)
It was great to see you again today, I will be in touch your labs as soon as possible Assuming all is well, we can plan to visit in about 6 months  Continue to work on exercise- ok to take a day off to rest when your hips hurt.  Consider adding an exercise bike to your routine to vary activity  covid booster in early November

## 2020-06-13 NOTE — Progress Notes (Addendum)
[facility] [place], [place], [place] [address] [phone]  Date:  06/14/2020   Name:  Sandra Hughes   DOB:  October 27, 1940   MRN:  [id number]  PCP:  Darreld Mclean, MD    Chief Complaint: Diabetes (med check, lab work) and Hypothyroidism   History of Present Illness:  Sandra Hughes is a 79 y.o. very pleasant female patient who presents with the following:  Patient here today for follow-up visit History of diabetes, hypothyroidism, hypertension, osteopenia, hyperlipidemia, vitamin D deficiency, depression, chronic back pain, edema for which she uses furosemide Last seen by myself in May of this year-that time she had worsening of her chronic back pain, suspicious for spinal stenosis  We started her on gabapentin for her symptoms, obtained plain films of the spine which showed multilevel degenerative change The gabapentin does seem to be helping; she would like to try decreasing to BID which is fine   Her TSH was also elevated in May, I increased her dose of levothyroxine-need to recheck TSH today She notes difficulty in losing weight - she is trying to walk for exercise.  After 3-4 days of exercise she will need to take a rest day due to hip pain  Wt Readings from Last 3 Encounters:  06/14/20 228 lb (103.4 kg)  01/31/20 215 lb (97.5 kg)  07/15/19 219 lb (99.3 kg)    Hepatitis C screening- today Flu vaccine-  Done today  Colon cancer screening due this fall Can do foot exam today- done  Shingrix COVID-19 series is up-to-date mammo UTD   Lab Results  Component Value Date   TSH 5.45 (H) 01/31/2020     Lab Results  Component Value Date   HGBA1C 5.8 01/31/2020    Patient Active Problem List   Diagnosis Date Noted  . Pulmonary nodule 11/21/2016  . HTN (hypertension) 05/15/2012  . Edema 05/15/2012  . Allergic rhinitis 05/15/2012  . Diabetes mellitus type II 05/15/2012  . Osteopenia 05/15/2012   . Depression 05/15/2012  . Hypothyroidism 05/15/2012  . GERD (gastroesophageal reflux disease) 05/15/2012  . Hypercholesterolemia 05/15/2012  . Vitamin D deficiency 05/15/2012    Past Medical History:  Diagnosis Date  . Allergy   . Anemia   . Anxiety   . Arthritis   . Cataract   . Depression   . Diabetes mellitus without complication ([facility])   . GERD (gastroesophageal reflux disease)   . Hyperlipidemia   . Hypertension   . Osteoporosis   . Thyroid disease     Past Surgical History:  Procedure Laterality Date  . ABDOMINAL HYSTERECTOMY     no cancer  . CHOLECYSTECTOMY    . COLONOSCOPY    . POLYPECTOMY    . UPPER GASTROINTESTINAL ENDOSCOPY      Social History   Tobacco Use  . Smoking status: Never Smoker  . Smokeless tobacco: Never Used  Substance Use Topics  . Alcohol use: No  . Drug use: No    Family History  Problem Relation Age of Onset  . Hypertension Mother   . COPD Father   . Colon polyps Father   . Arthritis Sister   . Diabetes Brother   . Cancer Maternal Grandfather        skin cancer  . Heart disease Paternal Grandfather   . Diabetes Sister   . Colon cancer Neg Hx   . Esophageal cancer Neg Hx   . Rectal  cancer Neg Hx   . Stomach cancer Neg Hx     Allergies  Allergen Reactions  . Aspirin Anaphylaxis    Medication list has been reviewed and updated.  Current Outpatient Medications on File Prior to Visit  Medication Sig Dispense Refill  . cycloSPORINE (RESTASIS) 0.05 % ophthalmic emulsion Place 1 drop into both eyes 2 (two) times daily. 5.5 mL 3  . furosemide (LASIX) 40 MG tablet Take 1 tablet (40 mg total) by mouth daily. 90 tablet 3  . gabapentin (NEURONTIN) 100 MG capsule Take 1 capsule (100 mg total) by mouth 3 (three) times daily. 90 capsule 3  . glucose blood (ONE TOUCH ULTRA TEST) test strip Check glucose 3 times daily 300 each 12  . KLOR-CON M10 10 MEQ tablet TAKE 1 TABLET BY MOUTH EVERY DAY 90 tablet 3  . levothyroxine (SYNTHROID)  125 MCG tablet Take 1 tablet (125 mcg total) by mouth daily. 30 tablet 6  . metFORMIN (GLUCOPHAGE) 500 MG tablet TAKE 1 TABLET BY MOUTH EVERY DAY WITH BREAKFAST 90 tablet 3  . omeprazole (PRILOSEC) 20 MG capsule TAKE 1 CAPSULE BY MOUTH EVERY DAY 90 capsule 3  . quinapril (ACCUPRIL) 20 MG tablet TAKE 1 TABLET BY MOUTH EVERY DAY IN THE MORNING 90 tablet 3  . rosuvastatin (CRESTOR) 20 MG tablet Take 1 tablet (20 mg total) by mouth daily. 90 tablet 3  . traZODone (DESYREL) 100 MG tablet TAKE 1/2 TABLET (50 MG TOTAL) BY MOUTH AT BEDTIME. 45 tablet 3   No current facility-administered medications on file prior to visit.    Review of Systems:  As per HPI- otherwise negative.   Physical Examination: Vitals:   06/14/20 1407  BP: 136/82  Pulse: 78  Resp: 17  SpO2: 97%   Vitals:   06/14/20 1407  Weight: 228 lb (103.4 kg)  Height: 5\' 3"  (1.6 m)   Body mass index is 40.39 kg/m. Ideal Body Weight: Weight in (lb) to have BMI = 25: 140.8  GEN: no acute distress.  Obese, looks well  HEENT: Atraumatic, Normocephalic.  Ears and Nose: No external deformity. CV: RRR, No M/G/R. No JVD. No thrill. No extra heart sounds. PULM: CTA B, no wheezes, crackles, rhonchi. No retractions. No resp. distress. No accessory muscle use. ABD: S, NT, ND, +BS. No rebound. No HSM. EXTR: No c/c- trace edema bilaterally  PSYCH: Normally interactive. Conversant.  Foot exam- normal   Assessment and Plan: Controlled type 2 diabetes mellitus without complication, without long-term current use of insulin (HCC) - Plan: Hemoglobin A1c, CANCELED: Hemoglobin A1c  Hypothyroidism (acquired) - Plan: TSH, CANCELED: TSH  Hypertension, unspecified type  Chronic bilateral low back pain with right-sided sciatica  Encounter for hepatitis C screening test for low risk patient - Plan: Hepatitis C antibody  Need for immunization against adenovirus  Immunization due - Plan: Flu Vaccine QUAD High Dose(Fluad)  Await A1c TSH-  may need adjustment Flu shot today Back pain better with gabapentin Offered hip films- she declines today Encouraged her to try varying her exercise program- perhaps alternate walking with exercise bike Will plan further follow- up pending labs.  This visit occurred during the SARS-CoV-2 public health emergency.  Safety protocols were in place, including screening questions prior to the visit, additional usage of staff PPE, and extensive cleaning of exam room while observing appropriate contact time as indicated for disinfecting solutions.    Signed Lamar Blinks, MD  Addendum 9/16, received her labs as below  Results for orders placed or  performed in visit on 06/14/20  Hemoglobin A1c  Result Value Ref Range   Hgb A1c MFr Bld 5.8 (H) <5.7 % of total Hgb   Mean Plasma Glucose 120 (calc)   eAG (mmol/L) 6.6 (calc)  TSH  Result Value Ref Range   TSH 0.18 (L) 0.40 - 4.50 mIU/L   A1c is stable in prediabetes range Thyroid is overtreated, current dose of 125 mcg.  Previously undertreated on 100 We will put her on 112 mcg, recheck TSH in 1 month  Your A1c-average blood sugar-is stable in the prediabetes range.  We will continue to monitor  Your TSH shows that your thyroid dose is now too high!  You are currently on 125 mcg, I will adjust to 112 mcg-prescription sent to your drugstore  Please set up a lab visit only in about 1 month to recheck your TSH  Otherwise, we can plan to visit in 6 months  Take care!

## 2020-06-14 ENCOUNTER — Ambulatory Visit (INDEPENDENT_AMBULATORY_CARE_PROVIDER_SITE_OTHER): Payer: Medicare Other | Admitting: Family Medicine

## 2020-06-14 ENCOUNTER — Encounter: Payer: Self-pay | Admitting: Family Medicine

## 2020-06-14 VITALS — BP 136/82 | HR 78 | Resp 17 | Ht 63.0 in | Wt 228.0 lb

## 2020-06-14 DIAGNOSIS — I10 Essential (primary) hypertension: Secondary | ICD-10-CM

## 2020-06-14 DIAGNOSIS — M5441 Lumbago with sciatica, right side: Secondary | ICD-10-CM | POA: Diagnosis not present

## 2020-06-14 DIAGNOSIS — E039 Hypothyroidism, unspecified: Secondary | ICD-10-CM | POA: Diagnosis not present

## 2020-06-14 DIAGNOSIS — G8929 Other chronic pain: Secondary | ICD-10-CM | POA: Diagnosis not present

## 2020-06-14 DIAGNOSIS — Z1159 Encounter for screening for other viral diseases: Secondary | ICD-10-CM | POA: Diagnosis not present

## 2020-06-14 DIAGNOSIS — Z23 Encounter for immunization: Secondary | ICD-10-CM | POA: Diagnosis not present

## 2020-06-14 DIAGNOSIS — E119 Type 2 diabetes mellitus without complications: Secondary | ICD-10-CM

## 2020-06-15 ENCOUNTER — Encounter: Payer: Self-pay | Admitting: Family Medicine

## 2020-06-15 LAB — HEPATITIS C ANTIBODY
Hepatitis C Ab: NONREACTIVE
SIGNAL TO CUT-OFF: 0.03 (ref <1.00)

## 2020-06-15 LAB — HEMOGLOBIN A1C
Hgb A1c MFr Bld: 5.8 % of total Hgb — ABNORMAL HIGH (ref <5.7)
Mean Plasma Glucose: 120 (calc)
eAG (mmol/L): 6.6 (calc)

## 2020-06-15 LAB — TSH: TSH: 0.18 mIU/L — ABNORMAL LOW (ref 0.40–4.50)

## 2020-06-15 MED ORDER — LEVOTHYROXINE SODIUM 112 MCG PO TABS: 112.0000 ug | ORAL_TABLET | Freq: Every day | ORAL | 6 refills | Status: DC

## 2020-06-15 NOTE — Addendum Note (Signed)
Addended by: Lamar Blinks C on: 06/15/2020 06:06 AM   Modules accepted: Orders

## 2020-06-17 ENCOUNTER — Other Ambulatory Visit: Payer: Self-pay | Admitting: Family Medicine

## 2020-06-17 DIAGNOSIS — G8929 Other chronic pain: Secondary | ICD-10-CM

## 2020-07-09 ENCOUNTER — Other Ambulatory Visit: Payer: Self-pay | Admitting: Family Medicine

## 2020-07-09 DIAGNOSIS — E119 Type 2 diabetes mellitus without complications: Secondary | ICD-10-CM

## 2020-07-09 DIAGNOSIS — K219 Gastro-esophageal reflux disease without esophagitis: Secondary | ICD-10-CM

## 2020-07-10 ENCOUNTER — Other Ambulatory Visit: Payer: Self-pay | Admitting: Family Medicine

## 2020-07-13 ENCOUNTER — Telehealth: Payer: Self-pay | Admitting: Family Medicine

## 2020-07-13 NOTE — Progress Notes (Signed)
  Chronic Care Management   Outreach Note  07/13/2020 Name: Sandra Hughes MRN: [id number] DOB: 09/11/1941  Referred by: Darreld Mclean, MD Reason for referral : No chief complaint on file.   An unsuccessful telephone outreach was attempted today. The patient was referred to the pharmacist for assistance with care management and care coordination.   Follow Up Plan:   Carley Perdue [employer] Scheduler

## 2020-07-24 ENCOUNTER — Other Ambulatory Visit: Payer: Self-pay | Admitting: Family Medicine

## 2020-07-24 DIAGNOSIS — F32A Depression, unspecified: Secondary | ICD-10-CM

## 2020-07-26 ENCOUNTER — Telehealth: Payer: Self-pay | Admitting: Family Medicine

## 2020-07-26 DIAGNOSIS — E039 Hypothyroidism, unspecified: Secondary | ICD-10-CM

## 2020-07-26 NOTE — Telephone Encounter (Signed)
Yes. Time to get TSH checked now.  I have ordered for her- please give her a call TY

## 2020-07-26 NOTE — Telephone Encounter (Signed)
Patient called in reference to TSh lab orders and would like to know if she should proceed in getting lab drawn ?  Patient is in question b/c we authorized her thyroid medication refill.

## 2020-07-26 NOTE — Telephone Encounter (Signed)
Please advise on lab

## 2020-07-27 NOTE — Telephone Encounter (Signed)
Lab has been scheduled for patient.

## 2020-07-28 ENCOUNTER — Other Ambulatory Visit: Payer: Self-pay

## 2020-07-28 ENCOUNTER — Other Ambulatory Visit: Payer: Medicare Other

## 2020-07-28 DIAGNOSIS — E039 Hypothyroidism, unspecified: Secondary | ICD-10-CM | POA: Diagnosis not present

## 2020-07-28 LAB — TSH: TSH: 0.53 mIU/L (ref 0.40–4.50)

## 2020-07-29 ENCOUNTER — Encounter: Payer: Self-pay | Admitting: Family Medicine

## 2020-08-27 ENCOUNTER — Other Ambulatory Visit: Payer: Self-pay | Admitting: Family Medicine

## 2020-08-27 DIAGNOSIS — I10 Essential (primary) hypertension: Secondary | ICD-10-CM

## 2020-08-31 ENCOUNTER — Other Ambulatory Visit: Payer: Self-pay | Admitting: Family Medicine

## 2020-08-31 DIAGNOSIS — I10 Essential (primary) hypertension: Secondary | ICD-10-CM

## 2020-08-31 DIAGNOSIS — Z23 Encounter for immunization: Secondary | ICD-10-CM | POA: Diagnosis not present

## 2020-11-12 ENCOUNTER — Encounter: Payer: Self-pay | Admitting: Gastroenterology

## 2020-12-19 ENCOUNTER — Other Ambulatory Visit: Payer: Self-pay | Admitting: Family Medicine

## 2020-12-19 DIAGNOSIS — G8929 Other chronic pain: Secondary | ICD-10-CM

## 2021-01-07 ENCOUNTER — Other Ambulatory Visit: Payer: Self-pay | Admitting: Family Medicine

## 2021-01-07 DIAGNOSIS — E039 Hypothyroidism, unspecified: Secondary | ICD-10-CM
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
## 2021-02-03 NOTE — Patient Instructions (Addendum)
It was good to see you again today- assuming all is well please see me in about 6 months  If not done already please get your covid 4th doses, and shingles vacine   I am sorry that you have been feeling so stressed and down. Let's increase your trazodone to 100 mg at bedtime- please message me in 1-2 weeks and let me now how this works for you.  If need be we can make another adjustment or medication change.  Please let me know sooner if you are feeling worse

## 2021-02-04 ENCOUNTER — Other Ambulatory Visit: Payer: Self-pay | Admitting: Family Medicine

## 2021-02-04 DIAGNOSIS — E039 Hypothyroidism, unspecified: Secondary | ICD-10-CM

## 2021-02-07 ENCOUNTER — Other Ambulatory Visit: Payer: Self-pay

## 2021-02-07 ENCOUNTER — Ambulatory Visit (INDEPENDENT_AMBULATORY_CARE_PROVIDER_SITE_OTHER): Payer: Medicare Other | Admitting: Family Medicine

## 2021-02-07 ENCOUNTER — Encounter: Payer: Self-pay | Admitting: Family Medicine

## 2021-02-07 VITALS — BP 128/84 | HR 88 | Temp 98.0°F | Resp 19 | Ht 63.0 in | Wt 236.0 lb

## 2021-02-07 DIAGNOSIS — G479 Sleep disorder, unspecified: Secondary | ICD-10-CM | POA: Diagnosis not present

## 2021-02-07 DIAGNOSIS — E039 Hypothyroidism, unspecified: Secondary | ICD-10-CM | POA: Diagnosis not present

## 2021-02-07 DIAGNOSIS — Z638 Other specified problems related to primary support group: Secondary | ICD-10-CM

## 2021-02-07 DIAGNOSIS — E119 Type 2 diabetes mellitus without complications: Secondary | ICD-10-CM | POA: Diagnosis not present

## 2021-02-07 DIAGNOSIS — R5383 Other fatigue: Secondary | ICD-10-CM

## 2021-02-07 DIAGNOSIS — I10 Essential (primary) hypertension: Secondary | ICD-10-CM | POA: Diagnosis not present

## 2021-02-07 DIAGNOSIS — E559 Vitamin D deficiency, unspecified: Secondary | ICD-10-CM

## 2021-02-07 LAB — BASIC METABOLIC PANEL
BUN: 15 mg/dL (ref 6–23)
CO2: 29 mEq/L (ref 19–32)
Calcium: 9.1 mg/dL (ref 8.4–10.5)
Chloride: 101 mEq/L (ref 96–112)
Creatinine, Ser: 1.07 mg/dL (ref 0.40–1.20)
GFR: 49.72 mL/min — ABNORMAL LOW (ref >60.00)
Glucose, Bld: 128 mg/dL — ABNORMAL HIGH (ref 70–99)
Potassium: 4 mEq/L (ref 3.5–5.1)
Sodium: 140 mEq/L (ref 135–145)

## 2021-02-07 LAB — CBC
HCT: 44.1 % (ref 36.0–46.0)
Hemoglobin: 15.1 g/dL — ABNORMAL HIGH (ref 12.0–15.0)
MCHC: 34.2 g/dL (ref 30.0–36.0)
MCV: 89.6 fl (ref 78.0–100.0)
Platelets: 282 10*3/uL (ref 150.0–400.0)
RBC: 4.93 Mil/uL (ref 3.87–5.11)
RDW: 14.5 % (ref 11.5–15.5)
WBC: 5.4 10*3/uL (ref 4.0–10.5)

## 2021-02-07 LAB — TSH: TSH: 2.11 u[IU]/mL (ref 0.35–4.50)

## 2021-02-07 LAB — HEMOGLOBIN A1C: Hgb A1c MFr Bld: 6.1 % (ref 4.6–6.5)

## 2021-02-07 LAB — VITAMIN D 25 HYDROXY (VIT D DEFICIENCY, FRACTURES): VITD: 34.7 ng/mL (ref 30.00–100.00)

## 2021-02-16 ENCOUNTER — Other Ambulatory Visit: Payer: Self-pay | Admitting: Family Medicine

## 2021-02-16 ENCOUNTER — Telehealth: Payer: Self-pay

## 2021-02-16 ENCOUNTER — Encounter: Payer: Self-pay | Admitting: Family Medicine

## 2021-02-16 DIAGNOSIS — E039 Hypothyroidism, unspecified: Secondary | ICD-10-CM

## 2021-02-16 MED ORDER — LEVOTHYROXINE SODIUM 112 MCG PO TABS: 112.0000 ug | ORAL_TABLET | Freq: Every day | ORAL | 3 refills | Status: DC

## 2021-02-16 NOTE — Telephone Encounter (Signed)
Pt called stating that she has not received her lab results and if her Levothyroxine will remain the same dosage if so, she will need a refill since 15 tablets were sent in because of her overdue OV.

## 2021-02-16 NOTE — Addendum Note (Signed)
Addended by: Darreld Mclean on: 02/16/2021 04:33 PM   Modules accepted: Orders

## 2021-02-16 NOTE — Telephone Encounter (Signed)
TSH looks normal? Please advise

## 2021-02-16 NOTE — Telephone Encounter (Signed)
Thank you!  I am not sure what happened with these results Will refill thyroid med and contact pt   Results for orders placed or performed in visit on 02/07/21  Hemoglobin A1c  Result Value Ref Range   Hgb A1c MFr Bld 6.1 4.6 - 6.5 %  Basic metabolic panel  Result Value Ref Range   Sodium 140 135 - 145 mEq/L   Potassium 4.0 3.5 - 5.1 mEq/L   Chloride 101 96 - 112 mEq/L   CO2 29 19 - 32 mEq/L   Glucose, Bld 128 (H) 70 - 99 mg/dL   BUN 15 6 - 23 mg/dL   Creatinine, Ser 1.07 0.40 - 1.20 mg/dL   GFR 49.72 (L) >60.00 mL/min   Calcium 9.1 8.4 - 10.5 mg/dL  TSH  Result Value Ref Range   TSH 2.11 0.35 - 4.50 uIU/mL  CBC  Result Value Ref Range   WBC 5.4 4.0 - 10.5 K/uL   RBC 4.93 3.87 - 5.11 Mil/uL   Platelets 282.0 150.0 - 400.0 K/uL   Hemoglobin 15.1 (H) 12.0 - 15.0 g/dL   HCT 44.1 36.0 - 46.0 %   MCV 89.6 78.0 - 100.0 fl   MCHC 34.2 30.0 - 36.0 g/dL   RDW 14.5 11.5 - 15.5 %  VITAMIN D 25 Hydroxy (Vit-D Deficiency, Fractures)  Result Value Ref Range   VITD 34.70 30.00 - 100.00 ng/mL

## 2021-02-21 ENCOUNTER — Other Ambulatory Visit: Payer: Self-pay | Admitting: Family Medicine

## 2021-02-21 DIAGNOSIS — I10 Essential (primary) hypertension: Secondary | ICD-10-CM

## 2021-03-02 DIAGNOSIS — E119 Type 2 diabetes mellitus without complications: Secondary | ICD-10-CM | POA: Diagnosis not present

## 2021-03-02 DIAGNOSIS — H3561 Retinal hemorrhage, right eye: Secondary | ICD-10-CM | POA: Diagnosis not present

## 2021-03-02 DIAGNOSIS — H2513 Age-related nuclear cataract, bilateral: Secondary | ICD-10-CM | POA: Diagnosis not present

## 2021-03-21 ENCOUNTER — Other Ambulatory Visit: Payer: Self-pay | Admitting: Family Medicine

## 2021-03-21 DIAGNOSIS — I10 Essential (primary) hypertension: Secondary | ICD-10-CM

## 2021-05-21 ENCOUNTER — Other Ambulatory Visit: Payer: Self-pay | Admitting: Family Medicine

## 2021-05-21 DIAGNOSIS — K219 Gastro-esophageal reflux disease without esophagitis: Secondary | ICD-10-CM

## 2021-05-21 DIAGNOSIS — F32A Depression, unspecified: Secondary | ICD-10-CM

## 2021-05-21 DIAGNOSIS — G8929 Other chronic pain: Secondary | ICD-10-CM

## 2021-05-21 DIAGNOSIS — E119 Type 2 diabetes mellitus without complications: Secondary | ICD-10-CM

## 2021-05-21 DIAGNOSIS — M5441 Lumbago with sciatica, right side: Secondary | ICD-10-CM

## 2021-07-02 ENCOUNTER — Other Ambulatory Visit: Payer: Self-pay | Admitting: Family Medicine

## 2021-07-02 DIAGNOSIS — Z1231 Encounter for screening mammogram for malignant neoplasm of breast: Secondary | ICD-10-CM

## 2021-07-03 ENCOUNTER — Ambulatory Visit
Admission: RE | Admit: 2021-07-03 | Discharge: 2021-07-03 | Disposition: A | Discharge status: Home / Self Care | Payer: Medicare Other | Source: Ambulatory Visit | Attending: Family Medicine | Admitting: Family Medicine

## 2021-07-03 ENCOUNTER — Other Ambulatory Visit: Payer: Self-pay

## 2021-07-03 DIAGNOSIS — Z1231 Encounter for screening mammogram for malignant neoplasm of breast: Secondary | ICD-10-CM | POA: Diagnosis not present

## 2021-07-04 ENCOUNTER — Other Ambulatory Visit: Payer: Self-pay | Admitting: Family Medicine

## 2021-07-04 DIAGNOSIS — I10 Essential (primary) hypertension: Secondary | ICD-10-CM
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
## 2021-07-13 NOTE — Patient Instructions (Addendum)
It was great to see you again today, I will be in touch with your labs Please consider getting the newest COVID-19 booster if not done already, and also the shingles vaccine It looks like your colon cancer screening is also due- I would suggest calling your GI doctor and setting up an appt especially since you are having these symptoms The way you are currently taking your medications is ok!  I ordered your bone density screening- to be done next month

## 2021-07-18 ENCOUNTER — Encounter: Payer: Self-pay | Admitting: Family Medicine

## 2021-07-18 ENCOUNTER — Ambulatory Visit (INDEPENDENT_AMBULATORY_CARE_PROVIDER_SITE_OTHER): Payer: Medicare Other | Admitting: Family Medicine

## 2021-07-18 ENCOUNTER — Other Ambulatory Visit: Payer: Self-pay

## 2021-07-18 VITALS — BP 124/80 | HR 92 | Temp 98.0°F | Resp 18 | Ht 63.0 in | Wt 238.4 lb

## 2021-07-18 DIAGNOSIS — E2839 Other primary ovarian failure: Secondary | ICD-10-CM

## 2021-07-18 DIAGNOSIS — G8929 Other chronic pain: Secondary | ICD-10-CM

## 2021-07-18 DIAGNOSIS — M8580 Other specified disorders of bone density and structure, unspecified site: Secondary | ICD-10-CM | POA: Diagnosis not present

## 2021-07-18 DIAGNOSIS — M25511 Pain in right shoulder: Secondary | ICD-10-CM | POA: Diagnosis not present

## 2021-07-18 DIAGNOSIS — Z23 Encounter for immunization: Secondary | ICD-10-CM

## 2021-07-18 DIAGNOSIS — E119 Type 2 diabetes mellitus without complications: Secondary | ICD-10-CM | POA: Diagnosis not present

## 2021-07-18 DIAGNOSIS — I10 Essential (primary) hypertension: Secondary | ICD-10-CM

## 2021-07-18 DIAGNOSIS — E039 Hypothyroidism, unspecified: Secondary | ICD-10-CM | POA: Diagnosis not present

## 2021-07-18 DIAGNOSIS — Z638 Other specified problems related to primary support group: Secondary | ICD-10-CM

## 2021-07-18 LAB — TSH: TSH: 0.84 u[IU]/mL (ref 0.35–5.50)

## 2021-07-18 LAB — COMPREHENSIVE METABOLIC PANEL
ALT: 12 U/L (ref 0–35)
AST: 19 U/L (ref 0–37)
Albumin: 4.2 g/dL (ref 3.5–5.2)
Alkaline Phosphatase: 78 U/L (ref 39–117)
BUN: 15 mg/dL (ref 6–23)
CO2: 30 mEq/L (ref 19–32)
Calcium: 9.3 mg/dL (ref 8.4–10.5)
Chloride: 103 mEq/L (ref 96–112)
Creatinine, Ser: 1.05 mg/dL (ref 0.40–1.20)
GFR: 50.7 mL/min — ABNORMAL LOW (ref >60.00)
Glucose, Bld: 122 mg/dL — ABNORMAL HIGH (ref 70–99)
Potassium: 4.5 mEq/L (ref 3.5–5.1)
Sodium: 140 mEq/L (ref 135–145)
Total Bilirubin: 0.5 mg/dL (ref 0.2–1.2)
Total Protein: 7.3 g/dL (ref 6.0–8.3)

## 2021-07-18 LAB — LIPID PANEL
Cholesterol: 147 mg/dL (ref 0–200)
HDL: 67.5 mg/dL (ref >39.00)
LDL Cholesterol: 43 mg/dL (ref 0–99)
NonHDL: 79.43
Total CHOL/HDL Ratio: 2
Triglycerides: 183 mg/dL — ABNORMAL HIGH (ref 0.0–149.0)
VLDL: 36.6 mg/dL (ref 0.0–40.0)

## 2021-07-18 LAB — HEMOGLOBIN A1C: Hgb A1c MFr Bld: 6.1 % (ref 4.6–6.5)

## 2021-07-26 ENCOUNTER — Other Ambulatory Visit: Payer: Self-pay

## 2021-07-26 ENCOUNTER — Ambulatory Visit (HOSPITAL_BASED_OUTPATIENT_CLINIC_OR_DEPARTMENT_OTHER)
Admission: RE | Admit: 2021-07-26 | Discharge: 2021-07-26 | Disposition: A | Discharge status: Home / Self Care | Payer: Medicare Other | Source: Ambulatory Visit | Attending: Family Medicine | Admitting: Family Medicine

## 2021-07-26 DIAGNOSIS — E2839 Other primary ovarian failure: Secondary | ICD-10-CM | POA: Diagnosis not present

## 2021-07-26 DIAGNOSIS — M8589 Other specified disorders of bone density and structure, multiple sites: Secondary | ICD-10-CM | POA: Diagnosis not present

## 2021-07-26 DIAGNOSIS — M8580 Other specified disorders of bone density and structure, unspecified site: Secondary | ICD-10-CM | POA: Insufficient documentation

## 2021-07-27 ENCOUNTER — Encounter: Payer: Self-pay | Admitting: Family Medicine

## 2021-07-27 DIAGNOSIS — M8580 Other specified disorders of bone density and structure, unspecified site: Secondary | ICD-10-CM

## 2021-07-27 MED ORDER — ALENDRONATE SODIUM 70 MG PO TABS: 70.0000 mg | ORAL_TABLET | ORAL | 3 refills | Status: DC

## 2021-08-06 DIAGNOSIS — Z20822 Contact with and (suspected) exposure to covid-19: Secondary | ICD-10-CM | POA: Diagnosis not present

## 2021-08-06 DIAGNOSIS — Z20828 Contact with and (suspected) exposure to other viral communicable diseases: Secondary | ICD-10-CM | POA: Diagnosis not present

## 2021-09-13 DIAGNOSIS — Z20828 Contact with and (suspected) exposure to other viral communicable diseases: Secondary | ICD-10-CM | POA: Diagnosis not present

## 2021-10-01 ENCOUNTER — Encounter: Payer: Self-pay | Admitting: Family Medicine

## 2021-10-01 ENCOUNTER — Other Ambulatory Visit: Payer: Self-pay | Admitting: Family Medicine

## 2021-10-01 DIAGNOSIS — I10 Essential (primary) hypertension: Secondary | ICD-10-CM

## 2021-10-01 NOTE — Telephone Encounter (Signed)
Pharmacy comment: Product Backordered/Unavailable:QUINAPRIL IS ON RECALL, PLEASE SEND IN APPROPRIATE ALTERNATIVE.   All Pharmacy Suggested Alternatives:   ramipril (ALTACE) 5 MG capsule benazepril (LOTENSIN) 20 MG tablet fosinopril (MONOPRIL) 20 MG tablet lisinopril (ZESTRIL) 20 MG tablet  To prescribe one of the alternatives listed above, open the encounter and click Replace.  Open Encounter

## 2021-10-02 NOTE — Telephone Encounter (Signed)
Quinapril has been recalled- pharmacy requesting alternative please.

## 2021-11-16 ENCOUNTER — Other Ambulatory Visit: Payer: Self-pay | Admitting: Family Medicine

## 2021-11-16 DIAGNOSIS — I10 Essential (primary) hypertension: Secondary | ICD-10-CM

## 2021-12-21 ENCOUNTER — Other Ambulatory Visit: Payer: Self-pay | Admitting: Family Medicine

## 2021-12-21 DIAGNOSIS — G8929 Other chronic pain: Secondary | ICD-10-CM

## 2021-12-28 ENCOUNTER — Other Ambulatory Visit: Payer: Self-pay | Admitting: Family Medicine

## 2021-12-28 DIAGNOSIS — I10 Essential (primary) hypertension: Secondary | ICD-10-CM

## 2021-12-28 DIAGNOSIS — E039 Hypothyroidism, unspecified: Secondary | ICD-10-CM

## 2022-01-23 DIAGNOSIS — Z23 Encounter for immunization: Secondary | ICD-10-CM | POA: Diagnosis not present

## 2022-01-24 DIAGNOSIS — Z20822 Contact with and (suspected) exposure to covid-19: Secondary | ICD-10-CM | POA: Diagnosis not present

## 2022-02-01 DIAGNOSIS — Z20822 Contact with and (suspected) exposure to covid-19: Secondary | ICD-10-CM | POA: Diagnosis not present

## 2022-02-01 DIAGNOSIS — R051 Acute cough: Secondary | ICD-10-CM | POA: Diagnosis not present

## 2022-02-01 DIAGNOSIS — R059 Cough, unspecified: Secondary | ICD-10-CM | POA: Diagnosis not present

## 2022-03-12 NOTE — Patient Instructions (Addendum)
It was great to see you again today, assuming all is well please see me in about 6 months Plan for 2nd shingrix and your colonoscopy soon We will STOP fosamax and work on getting you on Prolia instead for your bones

## 2022-03-12 NOTE — Progress Notes (Addendum)
[place] at [facility] [address] [place], [place] [address] [phone]  Date:  03/14/2022   Name:  Sandra Hughes   DOB:  [birth year]   MRN:  [id number]  PCP:  Darreld Mclean, MD    Chief Complaint: follow up and labs (Med Check and Blood Work/Concerns/ questions: 1. same pain 2. Abd pain 3. Refill on restasis/Eye exam: none recent. )   History of Present Illness:  Sandra Hughes is a 81 y.o. very pleasant female patient who presents with the following:  Seen today for follow-up-last seen by myself in October History of diabetes, hypothyroidism, hypertension, osteopenia, hyperlipidemia, vitamin D deficiency, depression, chronic back pain, edema for which she uses furosemide, chronic renal insufficiency  Colon cancer screening- she got a letter from GI and will schedule this soon  A1c is due-update today Eye exam- pt notes she is UTD, she reports her insurance only pays for screening ever 2 years  Labs done in [place] renal insufficiency at that time, creatinine 1.05 DEXA scan and mammogram are up-to-date She had her covid booster and her first shingrix so far   Fosamax Lasix 40 daily Potassium 10 mill equivalents daily Gabapentin 100 3 times daily Levothyroxine 112 Lisinopril 20 Metformin 500 daily Crestor 20 Trazodone 50 at bedtime  We started her on fosmax in the fall- however it is causing more body aches and pain and nausea.  I advised her to stop taking it and we can try something else Her last bone density showed osteopenia but high fracture risk  She would like to look into prolia instead  Patient Active Problem List   Diagnosis Date Noted   Pulmonary nodule 11/21/2016   HTN (hypertension) 05/15/2012   Edema 05/15/2012   Allergic rhinitis 05/15/2012   Diabetes mellitus type II 05/15/2012   Osteopenia 05/15/2012   Depression 05/15/2012   Hypothyroidism 05/15/2012   GERD (gastroesophageal reflux  disease) 05/15/2012   Hypercholesterolemia 05/15/2012   Vitamin D deficiency 05/15/2012    Past Medical History:  Diagnosis Date   Allergy    Anemia    Anxiety    Arthritis    Cataract    Depression    Diabetes mellitus without complication (HCC)    GERD (gastroesophageal reflux disease)    Hyperlipidemia    Hypertension    Osteoporosis    Thyroid disease     Past Surgical History:  Procedure Laterality Date   ABDOMINAL HYSTERECTOMY     no cancer   CHOLECYSTECTOMY     COLONOSCOPY     POLYPECTOMY     UPPER GASTROINTESTINAL ENDOSCOPY      Social History   Tobacco Use   Smoking status: Never   Smokeless tobacco: Never  Substance Use Topics   Alcohol use: No   Drug use: No    Family History  Problem Relation Age of Onset   Hypertension Mother    COPD Father    Colon polyps Father    Arthritis Sister    Diabetes Brother    Cancer Maternal Grandfather        skin cancer   Heart disease Paternal Grandfather    Diabetes Sister    Colon cancer Neg Hx    Esophageal cancer Neg Hx    Rectal cancer Neg Hx    Stomach cancer Neg Hx     Allergies  Allergen Reactions   Aspirin Anaphylaxis    Medication list has been reviewed  and updated.  Current Outpatient Medications on File Prior to Visit  Medication Sig Dispense Refill   alendronate (FOSAMAX) 70 MG tablet Take 1 tablet (70 mg total) by mouth every 7 (seven) days. Take with a full glass of water on an empty stomach. 12 tablet 3   cycloSPORINE (RESTASIS) 0.05 % ophthalmic emulsion Place 1 drop into both eyes 2 (two) times daily. 5.5 mL 3   furosemide (LASIX) 40 MG tablet TAKE 1 TABLET BY MOUTH EVERY DAY 90 tablet 1   gabapentin (NEURONTIN) 100 MG capsule TAKE 1 CAPSULE BY MOUTH THREE TIMES A DAY 270 capsule 0   glucose blood (ONE TOUCH ULTRA TEST) test strip Check glucose 3 times daily 300 each 12   KLOR-CON M10 10 MEQ tablet TAKE 1 TABLET BY MOUTH EVERY DAY 90 tablet 1   levothyroxine (SYNTHROID) 112 MCG  tablet TAKE 1 TABLET BY MOUTH DAILY BEFORE BREAKFAST. 90 tablet 3   lisinopril (ZESTRIL) 20 MG tablet Take 1 tablet (20 mg total) by mouth daily. 90 tablet 3   metFORMIN (GLUCOPHAGE) 500 MG tablet TAKE 1 TABLET BY MOUTH EVERY DAY WITH BREAKFAST 90 tablet 3   omeprazole (PRILOSEC) 20 MG capsule TAKE 1 CAPSULE BY MOUTH EVERY DAY 90 capsule 3   rosuvastatin (CRESTOR) 20 MG tablet TAKE 1 TABLET BY MOUTH EVERY DAY 90 tablet 3   traZODone (DESYREL) 100 MG tablet TAKE 1/2 TABLET (50 MG TOTAL) BY MOUTH AT BEDTIME. 45 tablet 3   No current facility-administered medications on file prior to visit.    Review of Systems:  As per HPI- otherwise negative.   Physical Examination: Vitals:   03/14/22 1427  BP: 118/74  Pulse: 84  Resp: 18  Temp: 98 F (36.7 C)  SpO2: 97%   Vitals:   03/14/22 1427  Weight: 238 lb (108 kg)  Height: '5\' 3"'$  (1.6 m)   Body mass index is 42.16 kg/m. Ideal Body Weight: Weight in (lb) to have BMI = 25: 140.8  GEN: no acute distress. Looks well, obese HEENT: Atraumatic, Normocephalic.  Ears and Nose: No external deformity. CV: RRR, No M/G/R. No JVD. No thrill. No extra heart sounds. PULM: CTA B, no wheezes, crackles, rhonchi. No retractions. No resp. distress. No accessory muscle use. ABD: S, NT, ND, +BS. No rebound. No HSM. EXTR: No c/c/e PSYCH: Normally interactive. Conversant.    Assessment and Plan: Osteopenia, unspecified location  Hypothyroidism (acquired) - Plan: TSH  Controlled type 2 diabetes mellitus without complication, without long-term current use of insulin (HCC) - Plan: Comprehensive metabolic panel, Hemoglobin A1c  Hypertension, unspecified type - Plan: CBC, Comprehensive metabolic panel  Estrogen deficiency  Dry eyes - Plan: cycloSPORINE (RESTASIS) 0.05 % ophthalmic emulsion  Patient seen today for follow-up.  She is overall doing well, but notes she is not able to tolerate Fosamax.  She has tried taking it for the last 8 months but  notes body aches and general malaise after her dose.  I advised her to stop taking this, we will work on getting her on Prolia instead.  She states agreement  Blood pressure under good control  Await labs to follow-up on diabetes and thyroid  Refilled her eyedrops for chronic dry eye  Signed Lamar Blinks, MD  Addendum 6/16, received labs as below.  Message to patient Results for orders placed or performed in visit on 03/14/22  CBC  Result Value Ref Range   WBC 7.4 4.0 - 10.5 K/uL   RBC 4.78 3.87 - 5.11 Mil/uL  Platelets 314.0 150.0 - 400.0 K/uL   Hemoglobin 14.5 12.0 - 15.0 g/dL   HCT 43.8 36.0 - 46.0 %   MCV 91.6 78.0 - 100.0 fl   MCHC 33.1 30.0 - 36.0 g/dL   RDW 14.5 11.5 - 15.5 %  Comprehensive metabolic panel  Result Value Ref Range   Sodium 139 135 - 145 mEq/L   Potassium 4.9 Hemolysis Seen... 3.5 - 5.1 mEq/L   Chloride 99 96 - 112 mEq/L   CO2 28 19 - 32 mEq/L   Glucose, Bld 131 (H) 70 - 99 mg/dL   BUN 18 6 - 23 mg/dL   Creatinine, Ser 1.18 0.40 - 1.20 mg/dL   Total Bilirubin 0.4 0.2 - 1.2 mg/dL   Alkaline Phosphatase 60 39 - 117 U/L   AST 27 0 - 37 U/L   ALT 12 0 - 35 U/L   Total Protein 7.6 6.0 - 8.3 g/dL   Albumin 4.3 3.5 - 5.2 g/dL   GFR 43.87 (L) >60.00 mL/min   Calcium 9.7 8.4 - 10.5 mg/dL  Hemoglobin A1c  Result Value Ref Range   Hgb A1c MFr Bld 6.1 4.6 - 6.5 %  TSH  Result Value Ref Range   TSH 1.10 0.35 - 5.50 uIU/mL

## 2022-03-14 ENCOUNTER — Other Ambulatory Visit: Payer: Self-pay | Admitting: Family Medicine

## 2022-03-14 ENCOUNTER — Telehealth: Payer: Self-pay

## 2022-03-14 ENCOUNTER — Ambulatory Visit (INDEPENDENT_AMBULATORY_CARE_PROVIDER_SITE_OTHER): Payer: Medicare Other | Admitting: Family Medicine

## 2022-03-14 VITALS — BP 118/74 | HR 84 | Temp 98.0°F | Resp 18 | Ht 63.0 in | Wt 238.0 lb

## 2022-03-14 DIAGNOSIS — E039 Hypothyroidism, unspecified: Secondary | ICD-10-CM

## 2022-03-14 DIAGNOSIS — E2839 Other primary ovarian failure: Secondary | ICD-10-CM | POA: Diagnosis not present

## 2022-03-14 DIAGNOSIS — E119 Type 2 diabetes mellitus without complications: Secondary | ICD-10-CM | POA: Diagnosis not present

## 2022-03-14 DIAGNOSIS — I10 Essential (primary) hypertension: Secondary | ICD-10-CM | POA: Diagnosis not present

## 2022-03-14 DIAGNOSIS — M8580 Other specified disorders of bone density and structure, unspecified site: Secondary | ICD-10-CM | POA: Diagnosis not present

## 2022-03-14 DIAGNOSIS — H04123 Dry eye syndrome of bilateral lacrimal glands: Secondary | ICD-10-CM

## 2022-03-14 MED ORDER — CYCLOSPORINE 0.05 % OP EMUL: 1.0000 [drp] | Freq: Two times a day (BID) | OPHTHALMIC | 3 refills | Status: AC

## 2022-03-14 NOTE — Telephone Encounter (Signed)
Please check EOB for Prolia. Pt can not tolerate Fosamax due to jaw pain, myalgias, and fatigue.

## 2022-03-15 ENCOUNTER — Encounter: Payer: Self-pay | Admitting: Family Medicine

## 2022-03-15 DIAGNOSIS — N183 Chronic kidney disease, stage 3 unspecified: Secondary | ICD-10-CM

## 2022-03-15 LAB — TSH: TSH: 1.1 u[IU]/mL (ref 0.35–5.50)

## 2022-03-15 LAB — CBC
HCT: 43.8 % (ref 36.0–46.0)
Hemoglobin: 14.5 g/dL (ref 12.0–15.0)
MCHC: 33.1 g/dL (ref 30.0–36.0)
MCV: 91.6 fl (ref 78.0–100.0)
Platelets: 314 10*3/uL (ref 150.0–400.0)
RBC: 4.78 Mil/uL (ref 3.87–5.11)
RDW: 14.5 % (ref 11.5–15.5)
WBC: 7.4 10*3/uL (ref 4.0–10.5)

## 2022-03-15 LAB — COMPREHENSIVE METABOLIC PANEL
ALT: 12 U/L (ref 0–35)
AST: 27 U/L (ref 0–37)
Albumin: 4.3 g/dL (ref 3.5–5.2)
Alkaline Phosphatase: 60 U/L (ref 39–117)
BUN: 18 mg/dL (ref 6–23)
CO2: 28 mEq/L (ref 19–32)
Calcium: 9.7 mg/dL (ref 8.4–10.5)
Chloride: 99 mEq/L (ref 96–112)
Creatinine, Ser: 1.18 mg/dL (ref 0.40–1.20)
GFR: 43.87 mL/min — ABNORMAL LOW (ref >60.00)
Glucose, Bld: 131 mg/dL — ABNORMAL HIGH (ref 70–99)
Potassium: 4.9 mEq/L (ref 3.5–5.1)
Sodium: 139 mEq/L (ref 135–145)
Total Bilirubin: 0.4 mg/dL (ref 0.2–1.2)
Total Protein: 7.6 g/dL (ref 6.0–8.3)

## 2022-03-15 LAB — HEMOGLOBIN A1C: Hgb A1c MFr Bld: 6.1 % (ref 4.6–6.5)

## 2022-03-15 NOTE — Telephone Encounter (Signed)
PA initiated via Covermymeds; KEY: [id number]. PA cancelled by plan.    The patient currently has access to the requested medication and a Prior Authorization is not needed for the patient/medication.

## 2022-03-20 NOTE — Telephone Encounter (Signed)
Prolia VOB initiated via [email or web address] ? ?New start ? ?

## 2022-03-21 ENCOUNTER — Other Ambulatory Visit: Payer: Self-pay | Admitting: Family Medicine

## 2022-03-21 DIAGNOSIS — G8929 Other chronic pain: Secondary | ICD-10-CM

## 2022-03-28 ENCOUNTER — Other Ambulatory Visit: Payer: Self-pay | Admitting: Family Medicine

## 2022-03-28 DIAGNOSIS — I10 Essential (primary) hypertension: Secondary | ICD-10-CM

## 2022-04-07 NOTE — Telephone Encounter (Signed)
Pt ready for scheduling on or after 04/07/22  Out-of-pocket cost due at time of visit: $0  Primary: Medicare Prolia co-insurance: 0% Admin fee co-insurance: 0%  Secondary: [place] medicaid (Qualified Medicare Beneficiary Database administrator) program) Prolia co-insurance: 0% Admin fee co-insurance: 0%  Deductible: does not apply  Prior Auth: not required PA# Valid:     ** This summary of benefits is an estimation of the patient's out-of-pocket cost. Exact cost may very based on individual plan coverage.

## 2022-04-09 NOTE — Telephone Encounter (Signed)
Schedule

## 2022-04-16 ENCOUNTER — Ambulatory Visit (INDEPENDENT_AMBULATORY_CARE_PROVIDER_SITE_OTHER): Payer: Medicare Other

## 2022-04-16 DIAGNOSIS — M8580 Other specified disorders of bone density and structure, unspecified site: Secondary | ICD-10-CM | POA: Diagnosis not present

## 2022-04-16 MED ORDER — DENOSUMAB 60 MG/ML ~~LOC~~ SOSY
60.0000 mg | PREFILLED_SYRINGE | Freq: Once | SUBCUTANEOUS | Status: AC
  Administered 2022-04-16: 60 mg via SUBCUTANEOUS

## 2022-04-16 NOTE — Progress Notes (Signed)
Dr Etter Sjogren DOD  Sandra Hughes is a 81 y.o. female presents to the office today for Prolia injectoin per physician's orders. Original order: 03/14/22 Prolia 60 mg (med), SQ  (route) was administered L ARM (location) today. Patient tolerated injection. Patient due for follow up labs/provider appt: No.  Patient next injection due: 6 months , appt made No- will schedule after next EOB is ran in 6 months.   [place], Darlis Loan

## 2022-05-10 NOTE — Telephone Encounter (Signed)
Last Prolia inj 04/16/22 Next Prolia inj due 10/18/22

## 2022-06-17 ENCOUNTER — Other Ambulatory Visit: Payer: Self-pay | Admitting: Family Medicine

## 2022-06-17 DIAGNOSIS — K219 Gastro-esophageal reflux disease without esophagitis: Secondary | ICD-10-CM

## 2022-06-17 DIAGNOSIS — E119 Type 2 diabetes mellitus without complications: Secondary | ICD-10-CM

## 2022-06-24 ENCOUNTER — Other Ambulatory Visit: Payer: Self-pay | Admitting: Family Medicine

## 2022-06-24 DIAGNOSIS — F32A Depression, unspecified: Secondary | ICD-10-CM

## 2022-06-24 DIAGNOSIS — I10 Essential (primary) hypertension: Secondary | ICD-10-CM

## 2022-07-05 ENCOUNTER — Encounter: Payer: Self-pay | Admitting: Gastroenterology

## 2022-07-05 ENCOUNTER — Other Ambulatory Visit: Payer: Self-pay | Admitting: Family Medicine

## 2022-07-05 DIAGNOSIS — Z1231 Encounter for screening mammogram for malignant neoplasm of breast: Secondary | ICD-10-CM

## 2022-07-09 ENCOUNTER — Ambulatory Visit (INDEPENDENT_AMBULATORY_CARE_PROVIDER_SITE_OTHER): Payer: Medicare Other | Admitting: *Deleted

## 2022-07-09 ENCOUNTER — Encounter: Payer: Self-pay | Admitting: Family Medicine

## 2022-07-09 DIAGNOSIS — Z23 Encounter for immunization: Secondary | ICD-10-CM | POA: Diagnosis not present

## 2022-07-09 NOTE — Progress Notes (Signed)
Patient in for high dose flu vaccine.  Vaccine given in left deltoid and patient tolerated well.

## 2022-07-10 ENCOUNTER — Ambulatory Visit
Admission: RE | Admit: 2022-07-10 | Discharge: 2022-07-10 | Disposition: A | Discharge status: Home / Self Care | Payer: Medicare Other | Source: Ambulatory Visit | Attending: Family Medicine | Admitting: Family Medicine

## 2022-07-10 DIAGNOSIS — Z1231 Encounter for screening mammogram for malignant neoplasm of breast: Secondary | ICD-10-CM | POA: Diagnosis not present

## 2022-07-23 ENCOUNTER — Ambulatory Visit (AMBULATORY_SURGERY_CENTER): Payer: Self-pay | Admitting: *Deleted

## 2022-07-23 ENCOUNTER — Telehealth: Payer: Self-pay | Admitting: *Deleted

## 2022-07-23 VITALS — Ht 63.0 in | Wt 239.2 lb

## 2022-07-23 DIAGNOSIS — Z86010 Personal history of colonic polyps: Secondary | ICD-10-CM

## 2022-07-23 MED ORDER — NA SULFATE-K SULFATE-MG SULF 17.5-3.13-1.6 GM/177ML PO SOLN: 1.0000 | Freq: Once | ORAL | 0 refills | Status: AC

## 2022-07-23 NOTE — Telephone Encounter (Signed)
Dr. Silverio Decamp  Pt is scheduled for 08-14-22.  I checked your schedule as well as the APPs.  There are no available appointments prior to procedure; no available appointments in November and December schedule isn't open.  How would you like to proceed?  Thank you, Adela Lank

## 2022-07-23 NOTE — Telephone Encounter (Signed)
Please schedule office visit next available appointment to discuss benefits and risks of ongoing surveillance colonoscopy given her age.  Thank you

## 2022-07-23 NOTE — Progress Notes (Signed)

## 2022-07-23 NOTE — Telephone Encounter (Signed)
Pt. Had pre-visit this am,RAS done 2/22,procedure scheduled 08/14/22,do she need OV Or can we proceed with procedure,please advise?

## 2022-07-24 NOTE — Telephone Encounter (Signed)
Ok to proceed if patient is fine with it. Thanks

## 2022-07-25 NOTE — Telephone Encounter (Signed)
Noted  

## 2022-08-12 ENCOUNTER — Telehealth: Payer: Self-pay | Admitting: Gastroenterology

## 2022-08-12 NOTE — Telephone Encounter (Signed)
Returned patient call and clarified to hold her DM meds the day of procedure until she's discharged home.  She noted wshe prefers to hold all meds that day until after procedure.  She expressed that your BP is well controlled and prefers to wait until she can eat to take anything.  All questions answered to my best ability

## 2022-08-12 NOTE — Telephone Encounter (Signed)
Inbound call from patient requesting a call back to discuss calcification on taking her  diabetic pill. Patient stated she seen on the instructions that it states not to take the pill on the day of but she thought she was told that she could. Please advise.

## 2022-08-14 ENCOUNTER — Encounter: Payer: Self-pay | Admitting: Gastroenterology

## 2022-08-14 ENCOUNTER — Ambulatory Visit (AMBULATORY_SURGERY_CENTER): Payer: Medicare Other | Admitting: Gastroenterology

## 2022-08-14 VITALS — BP 150/82 | HR 78 | Temp 96.6°F | Resp 13 | Ht 63.0 in | Wt 239.2 lb

## 2022-08-14 DIAGNOSIS — Z86010 Personal history of colonic polyps: Secondary | ICD-10-CM | POA: Diagnosis not present

## 2022-08-14 DIAGNOSIS — D120 Benign neoplasm of cecum: Secondary | ICD-10-CM

## 2022-08-14 DIAGNOSIS — I10 Essential (primary) hypertension: Secondary | ICD-10-CM | POA: Diagnosis not present

## 2022-08-14 DIAGNOSIS — E119 Type 2 diabetes mellitus without complications: Secondary | ICD-10-CM | POA: Diagnosis not present

## 2022-08-14 DIAGNOSIS — Z09 Encounter for follow-up examination after completed treatment for conditions other than malignant neoplasm: Secondary | ICD-10-CM

## 2022-08-14 MED ORDER — SODIUM CHLORIDE 0.9 % IV SOLN: 500.0000 mL | Freq: Once | INTRAVENOUS | Status: DC

## 2022-08-14 NOTE — Progress Notes (Signed)
Called to room to assist during endoscopic procedure.  Patient ID and intended procedure confirmed with present staff. Received instructions for my participation in the procedure from the performing physician.  

## 2022-08-14 NOTE — Patient Instructions (Signed)
Handouts provided on polyps, diverticulosis and hemorrhoids.   Resume previous diet. Continue present medications.  Await pathology results.  No repeat colonoscopy due to age.  Return to GI clinic as needed.   YOU HAD AN ENDOSCOPIC PROCEDURE TODAY AT [facility]:   Refer to the procedure report that was given to you for any specific questions about what was found during the examination.  If the procedure report does not answer your questions, please call your gastroenterologist to clarify.  If you requested that your care partner not be given the details of your procedure findings, then the procedure report has been included in a sealed envelope for you to review at your convenience later.  YOU SHOULD EXPECT: Some feelings of bloating in the abdomen. Passage of more gas than usual.  Walking can help get rid of the air that was put into your GI tract during the procedure and reduce the bloating. If you had a lower endoscopy (such as a colonoscopy or flexible sigmoidoscopy) you may notice spotting of blood in your stool or on the toilet paper. If you underwent a bowel prep for your procedure, you may not have a normal bowel movement for a few days.  Please Note:  You might notice some irritation and congestion in your nose or some drainage.  This is from the oxygen used during your procedure.  There is no need for concern and it should clear up in a day or so.  SYMPTOMS TO REPORT IMMEDIATELY:  Following lower endoscopy (colonoscopy or flexible sigmoidoscopy):  Excessive amounts of blood in the stool  Significant tenderness or worsening of abdominal pains  Swelling of the abdomen that is new, acute  Fever of 100F or higher  For urgent or emergent issues, a gastroenterologist can be reached at any hour by calling [phone]. Do not use MyChart messaging for urgent concerns.    DIET:  We do recommend a small meal at first, but then you may proceed to your regular diet.   Drink plenty of fluids but you should avoid alcoholic beverages for 24 hours.  ACTIVITY:  You should plan to take it easy for the rest of today and you should NOT DRIVE or use heavy machinery until tomorrow (because of the sedation medicines used during the test).    FOLLOW UP: Our staff will call the number listed on your records the next business day following your procedure.  We will call around 7:15- 8:00 am to check on you and address any questions or concerns that you may have regarding the information given to you following your procedure. If we do not reach you, we will leave a message.     If any biopsies were taken you will be contacted by phone or by letter within the next 1-3 weeks.  Please call us at [phone] if you have not heard about the biopsies in 3 weeks.    SIGNATURES/CONFIDENTIALITY: You and/or your care partner have signed paperwork which will be entered into your electronic medical record.  These signatures attest to the fact that that the information above on your After Visit Summary has been reviewed and is understood.  Full responsibility of the confidentiality of this discharge information lies with you and/or your care-partner.

## 2022-08-14 NOTE — Op Note (Signed)
[facility] Patient Name: Sandra Hughes Procedure Date: 08/14/2022 8:43 AM MRN: [id number] Endoscopist: Mauri Pole , MD, [id number] Age: 81 Referring MD:  Date of Birth: 06-May-1941 Gender: Female Account #: [id number] Procedure:                Colonoscopy Indications:              High risk colon cancer surveillance: Personal                            history of colonic polyps, High risk colon cancer                            surveillance: Personal history of sessile serrated                            colon polyp (10 mm or greater in size) Medicines:                Monitored Anesthesia Care Procedure:                Pre-Anesthesia Assessment:                           - Prior to the procedure, a History and Physical                            was performed, and patient medications and                            allergies were reviewed. The patient's tolerance of                            previous anesthesia was also reviewed. The risks                            and benefits of the procedure and the sedation                            options and risks were discussed with the patient.                            All questions were answered, and informed consent                            was obtained. Prior Anticoagulants: The patient has                            taken no anticoagulant or antiplatelet agents. ASA                            Grade Assessment: III - A patient with severe                            systemic disease. After reviewing the risks and  benefits, the patient was deemed in satisfactory                            condition to undergo the procedure.                           After obtaining informed consent, the colonoscope                            was passed under direct vision. Throughout the                            procedure, the patient's blood pressure, pulse, and                            oxygen  saturations were monitored continuously. The                            PCF-HQ190L Colonoscope was introduced through the                            anus and advanced to the the cecum, identified by                            appendiceal orifice and ileocecal valve. The                            colonoscopy was performed without difficulty. The                            patient tolerated the procedure well. The quality                            of the bowel preparation was excellent. The                            ileocecal valve, appendiceal orifice, and rectum                            were photographed. Scope In: 8:45:52 AM Scope Out: 9:06:04 AM Scope Withdrawal Time: 0 hours 18 minutes 8 seconds  Total Procedure Duration: 0 hours 20 minutes 12 seconds  Findings:                 The perianal and digital rectal examinations were                            normal.                           A 2 mm polyp was found in the cecum. The polyp was                            sessile. The polyp was removed with a cold biopsy  forceps. Resection and retrieval were complete.                           A 3 mm polyp was found in the ileocecal valve. The                            polyp was sessile. The polyp was removed with a                            piecemeal technique using a cold snare. Resection                            and retrieval were complete.                           Scattered small-mouthed diverticula were found in                            the sigmoid colon and descending colon.                           Non-bleeding external and internal hemorrhoids were                            found during retroflexion. The hemorrhoids were                            medium-sized. Complications:            No immediate complications. Estimated Blood Loss:     Estimated blood loss was minimal. Impression:               - One 2 mm polyp in the cecum, removed with a  cold                            biopsy forceps. Resected and retrieved.                           - One 3 mm polyp at the ileocecal valve, removed                            piecemeal using a cold snare. Resected and                            retrieved.                           - Diverticulosis in the sigmoid colon and in the                            descending colon.                           - Non-bleeding external and internal hemorrhoids. Recommendation:           - Patient has a contact number  available for                            emergencies. The signs and symptoms of potential                            delayed complications were discussed with the                            patient. Return to normal activities tomorrow.                            Written discharge instructions were provided to the                            patient.                           - Resume previous diet.                           - Continue present medications.                           - Await pathology results.                           - No repeat colonoscopy due to age.                           - Return to GI clinic PRN. Mauri Pole, MD 08/14/2022 9:15:32 AM This report has been signed electronically.

## 2022-08-14 NOTE — Progress Notes (Signed)
MAPs in low 60s and high 50s upon arrival in PACU.  Fluids wide open and HOB dropped to t-burg.  Pt alert and talking and oriented.  HR in high 90s so decided not to give anymore ephedrine

## 2022-08-14 NOTE — Progress Notes (Signed)
[facility] History and Physical   Primary Care Physician:  Copland, Gay Filler, MD   Reason for Procedure:  History of adenomatous colon polyps  Plan:    Surveillance colonoscopy with possible interventions as needed     HPI: Sandra Hughes is a very pleasant 81 y.o. female here for surveillance colonoscopy. Denies any nausea, vomiting, abdominal pain, melena or bright red blood per rectum  The risks and benefits as well as alternatives of endoscopic procedure(s) have been discussed and reviewed. All questions answered. The patient agrees to proceed.    Past Medical History:  Diagnosis Date   Allergy    Anemia    Anxiety    Arthritis    Asthma    AS A CHILD   Cataract    Depression    Diabetes mellitus without complication (HCC)    GERD (gastroesophageal reflux disease)    Hyperlipidemia    Hypertension    Neuromuscular disorder ([place])    NEUROPATHY,MILD EARY STAGE   Osteoporosis    Thyroid disease     Past Surgical History:  Procedure Laterality Date   ABDOMINAL HYSTERECTOMY     no cancer   CHOLECYSTECTOMY     COLONOSCOPY     POLYPECTOMY     UPPER GASTROINTESTINAL ENDOSCOPY      Prior to Admission medications   Medication Sig Start Date End Date Taking? Authorizing Provider  Alpha-Lipoic Acid 600 MG CAPS Take 600 mg by mouth. TAKE 2 DAILY 600 MG   Yes [provider]  cycloSPORINE (RESTASIS) 0.05 % ophthalmic emulsion Place 1 drop into both eyes 2 (two) times daily. 03/14/22  Yes Copland, Gay Filler, MD  furosemide (LASIX) 40 MG tablet TAKE 1 TABLET BY MOUTH EVERY DAY 06/24/22  Yes Copland, Gay Filler, MD  gabapentin (NEURONTIN) 100 MG capsule Take 1 capsule (100 mg total) by mouth 3 (three) times daily. 03/21/22  Yes Copland, Gay Filler, MD  glucose blood (ONE TOUCH ULTRA TEST) test strip Check glucose 3 times daily 07/16/18  Yes Copland, Gay Filler, MD  KLOR-CON M10 10 MEQ tablet TAKE 1 TABLET BY MOUTH EVERY DAY 03/28/22  Yes Copland, Gay Filler, MD  levothyroxine (SYNTHROID) 112 MCG tablet TAKE 1 TABLET BY MOUTH DAILY BEFORE BREAKFAST. 12/28/21  Yes Copland, Gay Filler, MD  lisinopril (ZESTRIL) 20 MG tablet Take 1 tablet (20 mg total) by mouth daily. 10/02/21  Yes Copland, Gay Filler, MD  metFORMIN (GLUCOPHAGE) 500 MG tablet TAKE 1 TABLET BY MOUTH EVERY DAY WITH BREAKFAST 06/17/22  Yes Copland, Gay Filler, MD  Multiple Vitamin (MULTIVITAMIN ADULT PO) Take by mouth daily. [place]   Yes [provider]  omeprazole (PRILOSEC) 20 MG capsule TAKE 1 CAPSULE BY MOUTH EVERY DAY 06/17/22  Yes Copland, Gay Filler, MD  rosuvastatin (CRESTOR) 20 MG tablet TAKE 1 TABLET BY MOUTH EVERY DAY 06/17/22  Yes Copland, Gay Filler, MD  traZODone (DESYREL) 100 MG tablet TAKE 1/2 TABLET (50 MG TOTAL) BY MOUTH AT BEDTIME. 06/24/22  Yes Copland, Gay Filler, MD  Denosumab (PROLIA [place]) Inject into the skin. TAKE EVERY 6 MONTHS    [provider]    Current Outpatient Medications  Medication Sig Dispense Refill   Alpha-Lipoic Acid 600 MG CAPS Take 600 mg by mouth. TAKE 2 DAILY 600 MG     cycloSPORINE (RESTASIS) 0.05 % ophthalmic emulsion Place 1 drop into both eyes 2 (two) times daily. 5.5 mL 3   furosemide (LASIX) 40 MG tablet TAKE 1 TABLET BY MOUTH  EVERY DAY 90 tablet 1   gabapentin (NEURONTIN) 100 MG capsule Take 1 capsule (100 mg total) by mouth 3 (three) times daily. 270 capsule 1   glucose blood (ONE TOUCH ULTRA TEST) test strip Check glucose 3 times daily 300 each 12   KLOR-CON M10 10 MEQ tablet TAKE 1 TABLET BY MOUTH EVERY DAY 90 tablet 1   levothyroxine (SYNTHROID) 112 MCG tablet TAKE 1 TABLET BY MOUTH DAILY BEFORE BREAKFAST. 90 tablet 3   lisinopril (ZESTRIL) 20 MG tablet Take 1 tablet (20 mg total) by mouth daily. 90 tablet 3   metFORMIN (GLUCOPHAGE) 500 MG tablet TAKE 1 TABLET BY MOUTH EVERY DAY WITH BREAKFAST 90 tablet 3   Multiple Vitamin (MULTIVITAMIN ADULT PO) Take by mouth daily. [place] MATURE     omeprazole (PRILOSEC) 20 MG  capsule TAKE 1 CAPSULE BY MOUTH EVERY DAY 90 capsule 3   rosuvastatin (CRESTOR) 20 MG tablet TAKE 1 TABLET BY MOUTH EVERY DAY 90 tablet 3   traZODone (DESYREL) 100 MG tablet TAKE 1/2 TABLET (50 MG TOTAL) BY MOUTH AT BEDTIME. 45 tablet 3   Denosumab (PROLIA [place]) Inject into the skin. TAKE EVERY 6 MONTHS     Current Facility-Administered Medications  Medication Dose Route Frequency Provider Last Rate Last Admin   0.9 %  sodium chloride infusion  500 mL Intravenous Once Mauri Pole, MD        Allergies as of 08/14/2022 - Review Complete 08/14/2022  Allergen Reaction Noted   Aspirin Anaphylaxis 05/15/2012    Family History  Problem Relation Age of Onset   Hypertension Mother    COPD Father    Colon polyps Father    Arthritis Sister    Diabetes Sister    Diabetes Brother    Cancer Maternal Grandfather        skin cancer   Heart disease Paternal Grandfather    Colon cancer Neg Hx    Esophageal cancer Neg Hx    Rectal cancer Neg Hx    Stomach cancer Neg Hx    Crohn's disease Neg Hx    Ulcerative colitis Neg Hx     Social History   Socioeconomic History   Marital status: Widowed    Spouse name: Not on file   Number of children: Not on file   Years of education: Not on file   Highest education level: Not on file  Occupational History   Not on file  Tobacco Use   Smoking status: Never    Passive exposure: Past (FATHER SMOKED)   Smokeless tobacco: Never  Vaping Use   Vaping Use: Never used  Substance and Sexual Activity   Alcohol use: No   Drug use: No   Sexual activity: Not Currently  Other Topics Concern   Not on file  Social History Narrative   Not on file   Social Determinants of Health   Financial Resource Strain: Not on file  Food Insecurity: Not on file  Transportation Needs: Not on file  Physical Activity: Not on file  Stress: Not on file  Social Connections: Not on file  Intimate Partner Violence: Not on file    Review of Systems:  All  other review of systems negative except as mentioned in the HPI.  Physical Exam: Vital signs in last 24 hours: Blood Pressure (Abnormal) 140/74   Pulse 83   Temperature (Abnormal) 96.6 F (35.9 C) (Skin)   Height '5\' 3"'$  (1.6 m)   Weight 239 lb 3.2 oz (108.5 kg)  Oxygen Saturation 94%   Body Mass Index 42.37 kg/m  General:   Alert, NAD Lungs:  Clear .   Heart:  Regular rate and rhythm Abdomen:  Soft, nontender and nondistended. Neuro/Psych:  Alert and cooperative. Normal mood and affect. A and O x 3  Reviewed labs, radiology imaging, old records and pertinent past GI work up  Patient is appropriate for planned procedure(s) and anesthesia in an ambulatory setting   K. Denzil Magnuson , MD [phone]

## 2022-08-14 NOTE — Progress Notes (Signed)
Pt's states no medical or surgical changes since previsit or office visit. VS assessed by D.T 

## 2022-08-15 ENCOUNTER — Telehealth: Payer: Self-pay

## 2022-08-15 NOTE — Telephone Encounter (Signed)
  Follow up Call-     08/14/2022    7:45 AM  Call back number  Post procedure Call Back phone  # [phone]  Permission to leave phone message Yes   Follow up call, LVM.

## 2022-08-19 DIAGNOSIS — M8580 Other specified disorders of bone density and structure, unspecified site: Secondary | ICD-10-CM | POA: Diagnosis not present

## 2022-08-19 DIAGNOSIS — E7800 Pure hypercholesterolemia, unspecified: Secondary | ICD-10-CM | POA: Diagnosis not present

## 2022-08-19 DIAGNOSIS — E1122 Type 2 diabetes mellitus with diabetic chronic kidney disease: Secondary | ICD-10-CM | POA: Diagnosis not present

## 2022-08-19 DIAGNOSIS — E559 Vitamin D deficiency, unspecified: Secondary | ICD-10-CM | POA: Diagnosis not present

## 2022-08-19 DIAGNOSIS — I129 Hypertensive chronic kidney disease with stage 1 through stage 4 chronic kidney disease, or unspecified chronic kidney disease: Secondary | ICD-10-CM | POA: Diagnosis not present

## 2022-08-19 DIAGNOSIS — N1832 Chronic kidney disease, stage 3b: Secondary | ICD-10-CM | POA: Diagnosis not present

## 2022-08-19 DIAGNOSIS — N1831 Chronic kidney disease, stage 3a: Secondary | ICD-10-CM | POA: Diagnosis not present

## 2022-08-19 DIAGNOSIS — E039 Hypothyroidism, unspecified: Secondary | ICD-10-CM | POA: Diagnosis not present

## 2022-09-02 LAB — COMPREHENSIVE METABOLIC PANEL
Calcium: 9.5 (ref 8.7–10.7)
eGFR: 48

## 2022-09-02 LAB — BASIC METABOLIC PANEL
BUN: 26 — AB (ref 4–21)
CO2: 20 (ref 13–22)
Chloride: 101 (ref 99–108)
Creatinine: 1.2 — AB (ref 0.5–1.1)
Glucose: 154
Potassium: 4.5 mEq/L (ref 3.5–5.1)
Sodium: 136 — AB (ref 137–147)

## 2022-09-03 DIAGNOSIS — Z23 Encounter for immunization: Secondary | ICD-10-CM | POA: Diagnosis not present

## 2022-09-03 DIAGNOSIS — N1831 Chronic kidney disease, stage 3a: Secondary | ICD-10-CM | POA: Diagnosis not present

## 2022-09-05 ENCOUNTER — Encounter: Payer: Self-pay | Admitting: Gastroenterology

## 2022-09-11 NOTE — Telephone Encounter (Signed)
Forwarding to rx prior auth team.

## 2022-09-12 NOTE — Telephone Encounter (Signed)
Prolia VOB initiated via [email or web address] 

## 2022-09-15 NOTE — Patient Instructions (Incomplete)
It was great to see you again today, I will be in touch with your labs.  Assuming all is well please see me in about 6 months  Consider getting a dose of RSV if not done already  Let's increase lisinopril to 30 mg for your BP- let me know how you respond to the increased dose  Let me know if you would like to look further at your back at some point- we can get imaging, try physical therapy and/ or try medication

## 2022-09-15 NOTE — Progress Notes (Addendum)
[facility] [address], [place],  [address] [phone]  Date:  09/18/2022   Name:  Sandra Hughes   DOB:  Apr 16, 1941   MRN:  [id number]  PCP:  Darreld Mclean, MD    Chief Complaint: med check (Concerns/ questions: 1. BP has been elevated. 2. More sob/Foot exam due/Eye exam: Jan 2024)   History of Present Illness:  [name] is a 81 y.o. very pleasant female patient who presents with the following:  Patient seen today for follow-up and medication check Most recent visit with myself was in June history of diabetes, hypothyroidism, hypertension, osteopenia, hyperlipidemia, vitamin D deficiency, depression, chronic back pain, edema for which she uses furosemide, chronic renal insufficiency Diabetes has been under very good control Lab Results  Component Value Date   HGBA1C 6.1 03/14/2022   Eye exam- scheduled for January  Foot exam needed Update A1c Urine microalbumin  Lab Results  Component Value Date   TSH 1.10 03/14/2022   Prolia- she is tolerating this well  Furosemide 40 daily Gabapentin 100 3 times daily Potassium 10 daily Levothyroxine 112 Lisinopril 20 Metformin 500 once daily- was stopped by nephrology Now taking farxiga  Prilosec 20 Crestor 10 Trazodone 50 at bedtime  Flu shot, COVID booster, Shingrix already complete Outside BMP is already on chart from earlier this month- nephrology  Pt notes she had a colonoscopy about a month ago- since that time she feels like her BP has been too high a lot of the time Her nephrologist wants her BP lower- Dr Hollie Salk gave her a goal of 110-130/60- 80  BP Readings from Last 3 Encounters:  09/18/22 (!) 142/82  08/14/22 (!) 150/82  03/14/22 118/74   She is taking ALA otc and feels like this helps with her mentation She has decreased her gabapentin to BID now and this seems to be controlling her neuropathy  She notes that she will have  back pain if she tries to stand for a long period of time  Plain x-rays 5/21:  LUMBAR SPINE - COMPLETE 4+ VIEW COMPARISON:  CT 11/19/2018 FINDINGS: Lumbar alignment within normal limits. Vertebral body heights are maintained. Diffuse degenerative changes throughout the lumbar spine with moderate changes at L3-L4 and L5-S1. Facet degenerative changes of the lower lumbar spine. Aortic atherosclerosis. IMPRESSION: Multilevel degenerative changes.  No acute osseous abnormality.  Patient Active Problem List   Diagnosis Date Noted   Pulmonary nodule 11/21/2016   HTN (hypertension) 05/15/2012   Edema 05/15/2012   Allergic rhinitis 05/15/2012   Controlled type 2 diabetes mellitus without complication, without long-term current use of insulin ([place]) 05/15/2012   Osteopenia 05/15/2012   Depression 05/15/2012   Hypothyroidism 05/15/2012   GERD (gastroesophageal reflux disease) 05/15/2012   Hypercholesterolemia 05/15/2012   Vitamin D deficiency 05/15/2012    Past Medical History:  Diagnosis Date   Allergy    Anemia    Anxiety    Arthritis    Asthma    AS A CHILD   Cataract    Depression    Diabetes mellitus without complication (HCC)    GERD (gastroesophageal reflux disease)    Hyperlipidemia    Hypertension    Neuromuscular disorder ([place])    NEUROPATHY,MILD EARY STAGE   Osteoporosis    Thyroid disease     Past Surgical History:  Procedure Laterality Date   ABDOMINAL HYSTERECTOMY     no cancer   CHOLECYSTECTOMY  COLONOSCOPY     POLYPECTOMY     UPPER GASTROINTESTINAL ENDOSCOPY      Social History   Tobacco Use   Smoking status: Never    Passive exposure: Past (FATHER SMOKED)   Smokeless tobacco: Never  Vaping Use   Vaping Use: Never used  Substance Use Topics   Alcohol use: No   Drug use: No    Family History  Problem Relation Age of Onset   Hypertension Mother    COPD Father    Colon polyps Father    Arthritis Sister    Diabetes Sister    Diabetes  Brother    Cancer Maternal Grandfather        skin cancer   Heart disease Paternal Grandfather    Colon cancer Neg Hx    Esophageal cancer Neg Hx    Rectal cancer Neg Hx    Stomach cancer Neg Hx    Crohn's disease Neg Hx    Ulcerative colitis Neg Hx     Allergies  Allergen Reactions   Aspirin Anaphylaxis    Medication list has been reviewed and updated.  Current Outpatient Medications on File Prior to Visit  Medication Sig Dispense Refill   Alpha-Lipoic Acid 600 MG CAPS Take 600 mg by mouth. TAKE 2 DAILY 600 MG     cycloSPORINE (RESTASIS) 0.05 % ophthalmic emulsion Place 1 drop into both eyes 2 (two) times daily. 5.5 mL 3   Denosumab (PROLIA [place]) Inject into the skin. TAKE EVERY 6 MONTHS     FARXIGA 10 MG TABS tablet Take 10 mg by mouth daily.     furosemide (LASIX) 40 MG tablet TAKE 1 TABLET BY MOUTH EVERY DAY 90 tablet 1   gabapentin (NEURONTIN) 100 MG capsule Take 1 capsule (100 mg total) by mouth 3 (three) times daily. 270 capsule 1   glucose blood (ONE TOUCH ULTRA TEST) test strip Check glucose 3 times daily 300 each 12   KLOR-CON M10 10 MEQ tablet TAKE 1 TABLET BY MOUTH EVERY DAY 90 tablet 1   levothyroxine (SYNTHROID) 112 MCG tablet TAKE 1 TABLET BY MOUTH DAILY BEFORE BREAKFAST. 90 tablet 3   Multiple Vitamin (MULTIVITAMIN ADULT PO) Take by mouth daily. [place] MATURE     omeprazole (PRILOSEC) 20 MG capsule TAKE 1 CAPSULE BY MOUTH EVERY DAY 90 capsule 3   rosuvastatin (CRESTOR) 20 MG tablet TAKE 1 TABLET BY MOUTH EVERY DAY 90 tablet 3   traZODone (DESYREL) 100 MG tablet TAKE 1/2 TABLET (50 MG TOTAL) BY MOUTH AT BEDTIME. 45 tablet 3   No current facility-administered medications on file prior to visit.    Review of Systems:  As per HPI- otherwise negative.   Physical Examination: Vitals:   09/18/22 1413  BP: (!) 142/82  Pulse: 80  Resp: 18  Temp: 97.6 F (36.4 C)  SpO2: 97%   Vitals:   09/18/22 1413  Weight: 240 lb 9.6 oz (109.1 kg)  Height: '5\' 3"'$  (1.6 m)    Body mass index is 42.62 kg/m. Ideal Body Weight: Weight in (lb) to have BMI = 25: 140.8  GEN: no acute distress. Obese, looks well  HEENT: Atraumatic, Normocephalic.  Ears and Nose: No external deformity. CV: RRR, No M/G/R. No JVD. No thrill. No extra heart sounds. PULM: CTA B, no wheezes, crackles, rhonchi. No retractions. No resp. distress. No accessory muscle use. ABD: S, NT, ND, +BS. No rebound. No HSM. EXTR: No c/c/e PSYCH: Normally interactive. Conversant.  Foot exam normal  Patient notes  back pain tends to occur across her lower lumbar spine, bilaterally. Negative straight leg raise  Assessment and Plan: Controlled type 2 diabetes mellitus without complication, without long-term current use of insulin ([place]) - Plan: Hemoglobin A1c, Microalbumin / creatinine urine ratio  Acquired hypothyroidism - Plan: TSH  Hypercholesterolemia - Plan: Lipid panel  Hypertension, unspecified type - Plan: CBC, lisinopril (ZESTRIL) 20 MG tablet  Gastroesophageal reflux disease, unspecified whether esophagitis present  Sensation of feeling cold - Plan: Ferritin  Abnormal hemoglobin ([place]) - Plan: Ferritin  Medication monitoring encounter - Plan: Ferritin  CRI (chronic renal insufficiency), stage 3 (moderate) (HCC)  Type 2 diabetes mellitus with diabetic neuropathy, without long-term current use of insulin (HCC) - Plan: Ferritin  Chronic bilateral low back pain without sciatica  Following up on diabetes today, labs are pending as above We will also check on her thyroid and cholesterol She notes she oftentimes feels cold, check ferritin and thyroid Blood pressure slightly above goal, increase lisinopril from 20 to 30 mg Her nephrologist recently discontinued metformin, started [place] instead We discussed her back pain.  I offered various further steps including physical therapy, x-ray, MRI, medication.  For the time being she declines all of the above, she will let me know if she would  like to take any further action  Signed Lamar Blinks, MD  Received her labs 12/22- message to pt Results for orders placed or performed in visit on 09/18/22  Hemoglobin A1c  Result Value Ref Range   Hgb A1c MFr Bld 6.2 4.6 - 6.5 %  Microalbumin / creatinine urine ratio  Result Value Ref Range   Microalb, Ur 1.1 0.0 - 1.9 mg/dL   Creatinine,U 107.5 mg/dL   Microalb Creat Ratio 1.1 0.0 - 30.0 mg/g  CBC  Result Value Ref Range   WBC 6.8 4.0 - 10.5 K/uL   RBC 4.96 3.87 - 5.11 Mil/uL   Platelets 335.0 150.0 - 400.0 K/uL   Hemoglobin 15.1 (H) 12.0 - 15.0 g/dL   HCT 45.1 36.0 - 46.0 %   MCV 91.0 78.0 - 100.0 fl   MCHC 33.5 30.0 - 36.0 g/dL   RDW 14.4 11.5 - 15.5 %  Lipid panel  Result Value Ref Range   Cholesterol 167 0 - 200 mg/dL   Triglycerides 121.0 0.0 - 149.0 mg/dL   HDL 80.40 >39.00 mg/dL   VLDL 24.2 0.0 - 40.0 mg/dL   LDL Cholesterol 62 0 - 99 mg/dL   Total CHOL/HDL Ratio 2    NonHDL 86.58   TSH  Result Value Ref Range   TSH 0.56 0.35 - 5.50 uIU/mL  Ferritin  Result Value Ref Range   Ferritin 52.1 10.0 - 291.0 ng/mL

## 2022-09-18 ENCOUNTER — Ambulatory Visit (INDEPENDENT_AMBULATORY_CARE_PROVIDER_SITE_OTHER): Payer: Medicare Other | Admitting: Family Medicine

## 2022-09-18 VITALS — BP 142/82 | HR 80 | Temp 97.6°F | Resp 18 | Ht 63.0 in | Wt 240.6 lb

## 2022-09-18 DIAGNOSIS — I10 Essential (primary) hypertension: Secondary | ICD-10-CM

## 2022-09-18 DIAGNOSIS — E7800 Pure hypercholesterolemia, unspecified: Secondary | ICD-10-CM

## 2022-09-18 DIAGNOSIS — E039 Hypothyroidism, unspecified: Secondary | ICD-10-CM

## 2022-09-18 DIAGNOSIS — E1140 Type 2 diabetes mellitus with diabetic neuropathy, unspecified: Secondary | ICD-10-CM

## 2022-09-18 DIAGNOSIS — R6889 Other general symptoms and signs: Secondary | ICD-10-CM

## 2022-09-18 DIAGNOSIS — K219 Gastro-esophageal reflux disease without esophagitis: Secondary | ICD-10-CM

## 2022-09-18 DIAGNOSIS — N183 Chronic kidney disease, stage 3 unspecified: Secondary | ICD-10-CM | POA: Diagnosis not present

## 2022-09-18 DIAGNOSIS — E119 Type 2 diabetes mellitus without complications: Secondary | ICD-10-CM

## 2022-09-18 DIAGNOSIS — G8929 Other chronic pain: Secondary | ICD-10-CM | POA: Diagnosis not present

## 2022-09-18 DIAGNOSIS — N2889 Other specified disorders of kidney and ureter: Secondary | ICD-10-CM

## 2022-09-18 DIAGNOSIS — Z5181 Encounter for therapeutic drug level monitoring: Secondary | ICD-10-CM | POA: Diagnosis not present

## 2022-09-18 DIAGNOSIS — M545 Low back pain, unspecified: Secondary | ICD-10-CM

## 2022-09-18 DIAGNOSIS — D582 Other hemoglobinopathies: Secondary | ICD-10-CM | POA: Diagnosis not present

## 2022-09-18 MED ORDER — LISINOPRIL 20 MG PO TABS: 30.0000 mg | ORAL_TABLET | Freq: Every day | ORAL | 3 refills | Status: DC

## 2022-09-18 NOTE — Telephone Encounter (Signed)
Any updates on the patients OOP cost? Due 10/17/22.

## 2022-09-19 ENCOUNTER — Other Ambulatory Visit: Payer: Self-pay | Admitting: Family Medicine

## 2022-09-19 DIAGNOSIS — I10 Essential (primary) hypertension: Secondary | ICD-10-CM

## 2022-09-19 LAB — CBC
HCT: 45.1 % (ref 36.0–46.0)
Hemoglobin: 15.1 g/dL — ABNORMAL HIGH (ref 12.0–15.0)
MCHC: 33.5 g/dL (ref 30.0–36.0)
MCV: 91 fl (ref 78.0–100.0)
Platelets: 335 10*3/uL (ref 150.0–400.0)
RBC: 4.96 Mil/uL (ref 3.87–5.11)
RDW: 14.4 % (ref 11.5–15.5)
WBC: 6.8 10*3/uL (ref 4.0–10.5)

## 2022-09-19 LAB — LIPID PANEL
Cholesterol: 167 mg/dL (ref 0–200)
HDL: 80.4 mg/dL (ref >39.00)
LDL Cholesterol: 62 mg/dL (ref 0–99)
NonHDL: 86.58
Total CHOL/HDL Ratio: 2
Triglycerides: 121 mg/dL (ref 0.0–149.0)
VLDL: 24.2 mg/dL (ref 0.0–40.0)

## 2022-09-19 LAB — TSH: TSH: 0.56 u[IU]/mL (ref 0.35–5.50)

## 2022-09-19 LAB — HEMOGLOBIN A1C: Hgb A1c MFr Bld: 6.2 % (ref 4.6–6.5)

## 2022-09-19 LAB — MICROALBUMIN / CREATININE URINE RATIO
Creatinine,U: 107.5 mg/dL
Microalb Creat Ratio: 1.1 mg/g (ref 0.0–30.0)
Microalb, Ur: 1.1 mg/dL (ref 0.0–1.9)

## 2022-09-19 LAB — FERRITIN: Ferritin: 52.1 ng/mL (ref 10.0–291.0)

## 2022-09-20 ENCOUNTER — Encounter: Payer: Self-pay | Admitting: Family Medicine

## 2022-10-07 ENCOUNTER — Other Ambulatory Visit (HOSPITAL_COMMUNITY): Payer: Self-pay

## 2022-10-07 NOTE — Telephone Encounter (Signed)
Would this be $0?

## 2022-10-07 NOTE — Telephone Encounter (Signed)
Pt ready for scheduling on or after 10/17/22  Out-of-pocket cost due at time of visit: $  Primary: [employer] Prolia co-insurance:  Admin fee co-insurance:   Secondary:  Prolia co-insurance:  Admin fee co-insurance:   Deductible: $0  Prior Auth: no  PA# Valid:     ** This summary of benefits is an estimation of the patient's out-of-pocket cost. Exact cost may very based on individual plan coverage.     Insurance verification completed.    The current 180 day co-pay is, $4.60.   The patient is insured through [employer]

## 2022-10-08 NOTE — Telephone Encounter (Signed)
MyChart message sent to the pt.

## 2022-10-08 NOTE — Telephone Encounter (Signed)
Out-of-pocket cost due at time of visit: $ 0

## 2022-10-17 ENCOUNTER — Ambulatory Visit (INDEPENDENT_AMBULATORY_CARE_PROVIDER_SITE_OTHER): Payer: Medicare Other

## 2022-10-17 DIAGNOSIS — M8580 Other specified disorders of bone density and structure, unspecified site: Secondary | ICD-10-CM | POA: Diagnosis not present

## 2022-10-17 MED ORDER — DENOSUMAB 60 MG/ML ~~LOC~~ SOSY
60.0000 mg | PREFILLED_SYRINGE | Freq: Once | SUBCUTANEOUS | Status: AC
  Administered 2022-10-17: 60 mg via SUBCUTANEOUS

## 2022-10-17 NOTE — Progress Notes (Signed)
Sandra Hughes is a 82 y.o. female presents to the office today for prolia injections, per physician's orders. Original order: Per Dr.Copland Prolia  (med), 60 mg/ml (dose),  subcutaneous (route) was administered left arm (location) today. Patient tolerated injection.  Patient next injection due: in 6 months, appt made No. She will be contacted in about 6 months when next injection is approved.   Jiles Prows

## 2022-10-22 ENCOUNTER — Telehealth: Payer: Self-pay | Admitting: *Deleted

## 2022-10-22 NOTE — Telephone Encounter (Signed)
Prolia given on 10/17/22.

## 2022-10-22 NOTE — Telephone Encounter (Signed)
-----  Message from Jiles Prows, [place] sent at 10/17/2022 11:25 AM EST ----- Prolia injection administered today. Will need again in  6 months

## 2022-11-02 ENCOUNTER — Other Ambulatory Visit: Payer: Self-pay | Admitting: Family Medicine

## 2022-11-02 DIAGNOSIS — I10 Essential (primary) hypertension: Secondary | ICD-10-CM

## 2022-11-02 DIAGNOSIS — E039 Hypothyroidism, unspecified: Secondary | ICD-10-CM

## 2022-11-02 DIAGNOSIS — G8929 Other chronic pain: Secondary | ICD-10-CM

## 2023-02-14 LAB — HM DIABETES EYE EXAM

## 2023-03-12 ENCOUNTER — Other Ambulatory Visit: Payer: Self-pay | Admitting: Family Medicine

## 2023-03-12 DIAGNOSIS — K219 Gastro-esophageal reflux disease without esophagitis: Secondary | ICD-10-CM

## 2023-03-14 ENCOUNTER — Other Ambulatory Visit: Payer: Self-pay | Admitting: Family Medicine

## 2023-03-14 DIAGNOSIS — I10 Essential (primary) hypertension: Secondary | ICD-10-CM

## 2023-03-15 NOTE — Progress Notes (Addendum)
[facility] [address] [place], [place] [address] [phone]  Date:  03/24/2023   Name:  Sandra Hughes   DOB:  04-10-1941   MRN:  [id number]  PCP:  Pearline Cables, MD    Chief Complaint: Follow-up (Concerns/ questions: 1. Cramping in the legs/ feet- she wonders if her Potassium is the issue. 2. Pt now gets her Marcelline Deist free through Nephrology (she follows up 04/24/23 for 6 month OV). 3 She stopped her Alpha Lipoic Acid due to blistering./AWV due/Eye exam: 02/14/23/A1C due)   History of Present Illness:  Sandra Hughes is a 81 y.o. very pleasant female patient who presents with the following:  Patient seen today for periodic follow-up- history of diabetes, hypothyroidism, hypertension, osteopenia, hyperlipidemia, vitamin D deficiency, depression, chronic back pain, edema for which she uses furosemide, chronic renal insufficiency Diabetes has been under very good control She tries to drink plenty of water- she is having leg and foot, hand cramps for about 3 months  She added a banana daily but it has not helped as much this time   Most recent visit with myself was in December-at that time she was doing overall well, her nephrologist had changed her from metformin to [place].  Blood pressure goal per her nephrologist, Dr. Signe Colt 110-130/60- 80  She has a nephrology appt at the end of this month   She does check her BP at home- may run 145/90s; we increased her lisinopril to 30 and she is seeing improvement the last few weeks  She notes she has been under some stress, they are doing a major renovation project at her home  Lab Results  Component Value Date   HGBA1C 6.2 09/18/2022   Eye exam- 02/14/23 Can update A1c- will update today  Prolia injection Farxiga 10 Lasix 40 mg daily Lisinopril 20 Gabapentin 100 3 times daily Levothyroxine 112 Crestor 20 Trazodone 50 mg at bedtime  Can offer coronary calcium-she  would like to think about this Mammogram is up-to-date Bone density can be updated in October Colonoscopy last year  Most recent nephrology note on chart dated December 28 Patient Active Problem List   Diagnosis Date Noted   Pulmonary nodule 11/21/2016   HTN (hypertension) 05/15/2012   Edema 05/15/2012   Allergic rhinitis 05/15/2012   Controlled type 2 diabetes mellitus without complication, without long-term current use of insulin (HCC) 05/15/2012   Osteopenia 05/15/2012   Depression 05/15/2012   Hypothyroidism 05/15/2012   GERD (gastroesophageal reflux disease) 05/15/2012   Hypercholesterolemia 05/15/2012   Vitamin D deficiency 05/15/2012    Past Medical History:  Diagnosis Date   Allergy    Anemia    Anxiety    Arthritis    Asthma    AS A CHILD   Cataract    Depression    Diabetes mellitus without complication (HCC)    GERD (gastroesophageal reflux disease)    Hyperlipidemia    Hypertension    Neuromuscular disorder (HCC)    NEUROPATHY,MILD EARY STAGE   Osteoporosis    Thyroid disease     Past Surgical History:  Procedure Laterality Date   ABDOMINAL HYSTERECTOMY     no cancer   CHOLECYSTECTOMY     COLONOSCOPY     POLYPECTOMY     UPPER GASTROINTESTINAL ENDOSCOPY      Social History   Tobacco Use   Smoking status: Never    Passive exposure: Past (FATHER SMOKED)  Smokeless tobacco: Never  Vaping Use   Vaping Use: Never used  Substance Use Topics   Alcohol use: No   Drug use: No    Family History  Problem Relation Age of Onset   Hypertension Mother    COPD Father    Colon polyps Father    Arthritis Sister    Diabetes Sister    Diabetes Brother    Cancer Maternal Grandfather        skin cancer   Heart disease Paternal Grandfather    Colon cancer Neg Hx    Esophageal cancer Neg Hx    Rectal cancer Neg Hx    Stomach cancer Neg Hx    Crohn's disease Neg Hx    Ulcerative colitis Neg Hx     Allergies  Allergen Reactions   Aspirin  Anaphylaxis   Alpha-Lipoic Acid Other (See Comments)    Skin blisters    Medication list has been reviewed and updated.  Current Outpatient Medications on File Prior to Visit  Medication Sig Dispense Refill   cycloSPORINE (RESTASIS) 0.05 % ophthalmic emulsion Place 1 drop into both eyes 2 (two) times daily. 5.5 mL 3   Denosumab (PROLIA [place]) Inject into the skin. TAKE EVERY 6 MONTHS     FARXIGA 10 MG TABS tablet Take 10 mg by mouth daily.     furosemide (LASIX) 40 MG tablet TAKE 1 TABLET BY MOUTH EVERY DAY 90 tablet 1   gabapentin (NEURONTIN) 100 MG capsule TAKE 1 CAPSULE (100 MG TOTAL) BY MOUTH THREE TIMES DAILY. 270 capsule 1   glucose blood (ONE TOUCH ULTRA TEST) test strip Check glucose 3 times daily 300 each 12   KLOR-CON M10 10 MEQ tablet TAKE 1 TABLET BY MOUTH EVERY DAY 90 tablet 1   levothyroxine (SYNTHROID) 112 MCG tablet TAKE 1 TABLET BY MOUTH EVERY DAY BEFORE BREAKFAST 90 tablet 3   lisinopril (ZESTRIL) 20 MG tablet Take 1.5 tablets (30 mg total) by mouth daily. 135 tablet 3   omeprazole (PRILOSEC) 20 MG capsule Take 1 capsule (20 mg total) by mouth daily. 30 capsule 0   rosuvastatin (CRESTOR) 20 MG tablet Take 1 tablet (20 mg total) by mouth daily. 30 tablet 0   traZODone (DESYREL) 100 MG tablet TAKE 1/2 TABLET (50 MG TOTAL) BY MOUTH AT BEDTIME. 45 tablet 3   No current facility-administered medications on file prior to visit.    Review of Systems:  As per HPI- otherwise negative.   Physical Examination: Vitals:   03/24/23 0905  BP: 132/80  Pulse: 81  Resp: 18  Temp: 97.7 F (36.5 C)  SpO2: 97%   Vitals:   03/24/23 0905  Weight: 230 lb 12.8 oz (104.7 kg)  Height: 5\' 3"  (1.6 m)   Body mass index is 40.88 kg/m. Ideal Body Weight: Weight in (lb) to have BMI = 25: 140.8  GEN: no acute distress. Obese, looks well  HEENT: Atraumatic, Normocephalic.  Ears and Nose: No external deformity. CV: RRR, No M/G/R. No JVD. No thrill. No extra heart sounds. PULM: CTA B,  no wheezes, crackles, rhonchi. No retractions. No resp. distress. No accessory muscle use. ABD: S, NT, ND. No rebound. No HSM. EXTR: No c/c/e PSYCH: Normally interactive. Conversant.    Assessment and Plan: Hypercholesterolemia - Plan: Lipid panel  Hypertension, unspecified type - Plan: CBC, Comprehensive metabolic panel  Controlled type 2 diabetes mellitus without complication, without long-term current use of insulin (HCC) - Plan: Comprehensive metabolic panel, Hemoglobin A1c  Medication monitoring encounter -  Plan: CBC, Comprehensive metabolic panel  CRI (chronic renal insufficiency), stage 3 (moderate) (HCC) - Plan: Comprehensive metabolic panel  Hypothyroidism (acquired) - Plan: TSH Patient seen today for follow-up.  Will monitor lipids with cholesterol profile, blood pressure looks good today.  Continue current medication Follow-up on diabetes with A1c Using levothyroxine 112, check TSH Muscle cramping.  She is on diuretics and also potassium, check metabolic profile Will plan further follow- up pending labs.    Signed Abbe Amsterdam, MD  Received labs as below, message to patient Results for orders placed or performed in visit on 03/24/23  CBC  Result Value Ref Range   WBC 6.0 4.0 - 10.5 K/uL   RBC 5.33 (H) 3.87 - 5.11 Mil/uL   Platelets 289.0 150.0 - 400.0 K/uL   Hemoglobin 15.8 (H) 12.0 - 15.0 g/dL   HCT 40.9 (H) 81.1 - 91.4 %   MCV 91.2 78.0 - 100.0 fl   MCHC 32.6 30.0 - 36.0 g/dL   RDW 78.2 95.6 - 21.3 %  Comprehensive metabolic panel  Result Value Ref Range   Sodium 140 135 - 145 mEq/L   Potassium 4.6 3.5 - 5.1 mEq/L   Chloride 103 96 - 112 mEq/L   CO2 27 19 - 32 mEq/L   Glucose, Bld 134 (H) 70 - 99 mg/dL   BUN 19 6 - 23 mg/dL   Creatinine, Ser 0.86 (H) 0.40 - 1.20 mg/dL   Total Bilirubin 0.5 0.2 - 1.2 mg/dL   Alkaline Phosphatase 55 39 - 117 U/L   AST 16 0 - 37 U/L   ALT 10 0 - 35 U/L   Total Protein 7.5 6.0 - 8.3 g/dL   Albumin 4.3 3.5 - 5.2 g/dL    GFR 57.84 (L) >69.62 mL/min   Calcium 9.7 8.4 - 10.5 mg/dL  Hemoglobin X5M  Result Value Ref Range   Hgb A1c MFr Bld 6.1 4.6 - 6.5 %  Lipid panel  Result Value Ref Range   Cholesterol 155 0 - 200 mg/dL   Triglycerides 841.3 (H) 0.0 - 149.0 mg/dL   HDL 24.40 >10.27 mg/dL   VLDL 25.3 0.0 - 66.4 mg/dL   LDL Cholesterol 54 0 - 99 mg/dL   Total CHOL/HDL Ratio 2    NonHDL 90.59   TSH  Result Value Ref Range   TSH 0.50 0.35 - 5.50 uIU/mL

## 2023-03-15 NOTE — Patient Instructions (Addendum)
It was great to see you again today, I will be in touch with your labs.  Assuming all is well please see me back in about 6 months  Please let me know if your BP starts going up again!   We can do your bone density this fall   We can do a CT coronary for you at some point if you like!

## 2023-03-18 ENCOUNTER — Telehealth: Payer: Self-pay

## 2023-03-18 NOTE — Telephone Encounter (Signed)
Prolia VOB initiated via [email or web address]  Last Prolia inj: 10/17/22 Next Prolia inj DUE: 04/16/23

## 2023-03-18 NOTE — Telephone Encounter (Signed)
Pt ready for scheduling for PROLIA on or after : 04/16/23  Out-of-pocket cost due at time of visit: $0  Primary: [place] MEDICARE Prolia co-insurance: 0% Admin fee co-insurance: 0%  Secondary: [place] MEDICAID Prolia co-insurance:  Admin fee co-insurance:   Medical Benefit Details: Date Benefits were checked: 618/24 Deductible: NO/ Coinsurance: 0%/ Admin Fee: 0%  Prior Auth: N/A PA# Expiration Date:    Pharmacy benefit: Copay $--- If patient wants fill through the pharmacy benefit please send prescription to:  --- , and include estimated need by date in rx notes. Pharmacy will ship medication directly to the office.  Patient NOT eligible for Prolia Copay Card. Copay Card can make patient's cost as little as $25. Link to apply: [email or web address]://[email or web address].[email or web address]  ** This summary of benefits is an estimation of the patient's out-of-pocket cost. Exact cost may very based on individual plan coverage.

## 2023-03-20 NOTE — Telephone Encounter (Signed)
Pt scheduled  

## 2023-03-24 ENCOUNTER — Encounter: Payer: Self-pay | Admitting: Family Medicine

## 2023-03-24 ENCOUNTER — Ambulatory Visit (INDEPENDENT_AMBULATORY_CARE_PROVIDER_SITE_OTHER): Payer: Medicare Other | Admitting: Family Medicine

## 2023-03-24 VITALS — BP 132/80 | HR 81 | Temp 97.7°F | Resp 18 | Ht 63.0 in | Wt 230.8 lb

## 2023-03-24 DIAGNOSIS — Z5181 Encounter for therapeutic drug level monitoring: Secondary | ICD-10-CM

## 2023-03-24 DIAGNOSIS — Z7984 Long term (current) use of oral hypoglycemic drugs: Secondary | ICD-10-CM

## 2023-03-24 DIAGNOSIS — E039 Hypothyroidism, unspecified: Secondary | ICD-10-CM | POA: Diagnosis not present

## 2023-03-24 DIAGNOSIS — N183 Chronic kidney disease, stage 3 unspecified: Secondary | ICD-10-CM

## 2023-03-24 DIAGNOSIS — E119 Type 2 diabetes mellitus without complications: Secondary | ICD-10-CM | POA: Diagnosis not present

## 2023-03-24 DIAGNOSIS — I10 Essential (primary) hypertension: Secondary | ICD-10-CM | POA: Diagnosis not present

## 2023-03-24 DIAGNOSIS — E7800 Pure hypercholesterolemia, unspecified: Secondary | ICD-10-CM | POA: Diagnosis not present

## 2023-03-24 LAB — CBC
HCT: 48.6 % — ABNORMAL HIGH (ref 36.0–46.0)
Hemoglobin: 15.8 g/dL — ABNORMAL HIGH (ref 12.0–15.0)
MCHC: 32.6 g/dL (ref 30.0–36.0)
MCV: 91.2 fl (ref 78.0–100.0)
Platelets: 289 10*3/uL (ref 150.0–400.0)
RBC: 5.33 Mil/uL — ABNORMAL HIGH (ref 3.87–5.11)
RDW: 15 % (ref 11.5–15.5)
WBC: 6 10*3/uL (ref 4.0–10.5)

## 2023-03-24 LAB — LIPID PANEL
Cholesterol: 155 mg/dL (ref 0–200)
HDL: 64.3 mg/dL (ref >39.00)
LDL Cholesterol: 54 mg/dL (ref 0–99)
NonHDL: 90.59
Total CHOL/HDL Ratio: 2
Triglycerides: 181 mg/dL — ABNORMAL HIGH (ref 0.0–149.0)
VLDL: 36.2 mg/dL (ref 0.0–40.0)

## 2023-03-24 LAB — COMPREHENSIVE METABOLIC PANEL
ALT: 10 U/L (ref 0–35)
AST: 16 U/L (ref 0–37)
Albumin: 4.3 g/dL (ref 3.5–5.2)
Alkaline Phosphatase: 55 U/L (ref 39–117)
BUN: 19 mg/dL (ref 6–23)
CO2: 27 mEq/L (ref 19–32)
Calcium: 9.7 mg/dL (ref 8.4–10.5)
Chloride: 103 mEq/L (ref 96–112)
Creatinine, Ser: 1.22 mg/dL — ABNORMAL HIGH (ref 0.40–1.20)
GFR: 41.85 mL/min — ABNORMAL LOW (ref >60.00)
Glucose, Bld: 134 mg/dL — ABNORMAL HIGH (ref 70–99)
Potassium: 4.6 mEq/L (ref 3.5–5.1)
Sodium: 140 mEq/L (ref 135–145)
Total Bilirubin: 0.5 mg/dL (ref 0.2–1.2)
Total Protein: 7.5 g/dL (ref 6.0–8.3)

## 2023-03-24 LAB — TSH: TSH: 0.5 u[IU]/mL (ref 0.35–5.50)

## 2023-03-24 LAB — HEMOGLOBIN A1C: Hgb A1c MFr Bld: 6.1 % (ref 4.6–6.5)

## 2023-04-24 ENCOUNTER — Ambulatory Visit (INDEPENDENT_AMBULATORY_CARE_PROVIDER_SITE_OTHER): Payer: Medicare Other

## 2023-04-24 ENCOUNTER — Telehealth: Payer: Self-pay | Admitting: *Deleted

## 2023-04-24 DIAGNOSIS — I129 Hypertensive chronic kidney disease with stage 1 through stage 4 chronic kidney disease, or unspecified chronic kidney disease: Secondary | ICD-10-CM | POA: Diagnosis not present

## 2023-04-24 DIAGNOSIS — M810 Age-related osteoporosis without current pathological fracture: Secondary | ICD-10-CM | POA: Diagnosis not present

## 2023-04-24 DIAGNOSIS — D649 Anemia, unspecified: Secondary | ICD-10-CM | POA: Diagnosis not present

## 2023-04-24 DIAGNOSIS — E785 Hyperlipidemia, unspecified: Secondary | ICD-10-CM | POA: Diagnosis not present

## 2023-04-24 DIAGNOSIS — F32A Depression, unspecified: Secondary | ICD-10-CM | POA: Diagnosis not present

## 2023-04-24 DIAGNOSIS — E1122 Type 2 diabetes mellitus with diabetic chronic kidney disease: Secondary | ICD-10-CM | POA: Diagnosis not present

## 2023-04-24 DIAGNOSIS — N1831 Chronic kidney disease, stage 3a: Secondary | ICD-10-CM | POA: Diagnosis not present

## 2023-04-24 DIAGNOSIS — D631 Anemia in chronic kidney disease: Secondary | ICD-10-CM | POA: Diagnosis not present

## 2023-04-24 DIAGNOSIS — I10 Essential (primary) hypertension: Secondary | ICD-10-CM | POA: Diagnosis not present

## 2023-04-24 MED ORDER — DENOSUMAB 60 MG/ML ~~LOC~~ SOSY
60.0000 mg | PREFILLED_SYRINGE | Freq: Once | SUBCUTANEOUS | Status: AC
Start: 2023-04-24 — End: 2023-04-24
  Administered 2023-04-24: 60 mg via SUBCUTANEOUS

## 2023-04-24 NOTE — Telephone Encounter (Signed)
-----   Message from [facility] C sent at 04/24/2023  9:21 AM EDT ----- Regarding: Prolia Pt received Prolia today, 04/24/23.

## 2023-04-24 NOTE — Progress Notes (Signed)
Pt here today for Prolia injection per Dr. Patsy Lager.   Prolia 60mg /mL injected subcutaneous into L arm. Pt tolerated injection well.  Next in 6 months.

## 2023-04-25 LAB — LAB REPORT - SCANNED
Albumin, Urine POC: <3
Creatinine, POC: 70.7 mg/dL
Microalb Creat Ratio: <4

## 2023-06-08 ENCOUNTER — Other Ambulatory Visit: Payer: Self-pay | Admitting: Family Medicine

## 2023-06-08 DIAGNOSIS — I10 Essential (primary) hypertension: Secondary | ICD-10-CM

## 2023-06-10 ENCOUNTER — Other Ambulatory Visit: Payer: Self-pay | Admitting: Family Medicine

## 2023-06-10 DIAGNOSIS — F32A Depression, unspecified: Secondary | ICD-10-CM

## 2023-07-01 ENCOUNTER — Other Ambulatory Visit: Payer: Self-pay | Admitting: Family Medicine

## 2023-07-01 DIAGNOSIS — K219 Gastro-esophageal reflux disease without esophagitis: Secondary | ICD-10-CM

## 2023-07-05 DIAGNOSIS — Z23 Encounter for immunization: Secondary | ICD-10-CM | POA: Diagnosis not present

## 2023-07-15 ENCOUNTER — Other Ambulatory Visit: Payer: Self-pay | Admitting: Family Medicine

## 2023-07-15 DIAGNOSIS — Z1231 Encounter for screening mammogram for malignant neoplasm of breast: Secondary | ICD-10-CM

## 2023-07-15 NOTE — Patient Instructions (Incomplete)
It was good to see you today, I will be in touch with your labs soon as possible We will also get some x-rays of your left shoulder and upper arm bone  Assuming all is well please see me in about 6 months Recommend COVID booster at your pharmacy  Flu given today!

## 2023-07-15 NOTE — Progress Notes (Unsigned)
[facility] [address], [address] [place], [place] [address] [phone]  Date:  07/16/2023   Name:  Sandra Hughes   DOB:  October 28, 1940   MRN:  [id number]  PCP:  Pearline Cables, MD    Chief Complaint: med check (Concerns/ questions: 1. Arm pain since Prolia in July. 2. Now on only 1 tab of Lisinoril. /Flu shot today: yes/Awv due)   History of Present Illness:  Sandra Hughes is a 82 y.o. very pleasant female patient who presents with the following:  Patient seen today for medication follow-up and flu vaccine Most recent visit with myself was in June  history of diabetes, hypothyroidism, hypertension, osteopenia, hyperlipidemia, vitamin D deficiency, depression, chronic back pain, edema for which she uses furosemide, chronic renal insufficiency   She is followed by nephrology, Dr. Signe Colt -most recent visit in July  Wt Readings from Last 3 Encounters:  07/16/23 299 lb 6.4 oz (135.8 kg)  03/24/23 230 lb 12.8 oz (104.7 kg)  09/18/22 240 lb 9.6 oz (109.1 kg)   She got a prolia shot in July- since then she notes that her L arm hurts; it feels like persistent muscle sorenss She has tried a topical muscle rub that does not help  No other symptoms except for her upper arm feeling sore  Flu vaccine- give today  COVID booster-recommended Can update foot exam-done today  Labs done in June, CMP, lipid, A1c, TSH  Prolia injection Farxiga 10 Furosemide 40 mg daily/potassium once daily Gabapentin 100 3 times daily Levothyroxine 112 Lisinopril 30 mg Omeprazole Crestor 20 Trazodone  Lab Results  Component Value Date   TSH 0.50 03/24/2023    Patient Active Problem List   Diagnosis Date Noted   Pulmonary nodule 11/21/2016   HTN (hypertension) 05/15/2012   Edema 05/15/2012   Allergic rhinitis 05/15/2012   Controlled type 2 diabetes mellitus without complication, without long-term current use of insulin (HCC) 05/15/2012    Osteopenia 05/15/2012   Depression 05/15/2012   Hypothyroidism 05/15/2012   GERD (gastroesophageal reflux disease) 05/15/2012   Hypercholesterolemia 05/15/2012   Vitamin D deficiency 05/15/2012    Past Medical History:  Diagnosis Date   Allergy    Anemia    Anxiety    Arthritis    Asthma    AS A CHILD   Cataract    Depression    Diabetes mellitus without complication (HCC)    GERD (gastroesophageal reflux disease)    Hyperlipidemia    Hypertension    Neuromuscular disorder (HCC)    NEUROPATHY,MILD EARY STAGE   Osteoporosis    Thyroid disease     Past Surgical History:  Procedure Laterality Date   ABDOMINAL HYSTERECTOMY     no cancer   CHOLECYSTECTOMY     COLONOSCOPY     POLYPECTOMY     UPPER GASTROINTESTINAL ENDOSCOPY      Social History   Tobacco Use   Smoking status: Never    Passive exposure: Past (FATHER SMOKED)   Smokeless tobacco: Never  Vaping Use   Vaping status: Never Used  Substance Use Topics   Alcohol use: No   Drug use: No    Family History  Problem Relation Age of Onset   Hypertension Mother    COPD Father    Colon polyps Father    Arthritis Sister    Diabetes Sister    Diabetes Brother    Cancer Maternal Grandfather  skin cancer   Heart disease Paternal Grandfather    Colon cancer Neg Hx    Esophageal cancer Neg Hx    Rectal cancer Neg Hx    Stomach cancer Neg Hx    Crohn's disease Neg Hx    Ulcerative colitis Neg Hx     Allergies  Allergen Reactions   Aspirin Anaphylaxis   Alpha-Lipoic Acid Other (See Comments)    Skin blisters    Medication list has been reviewed and updated.  Current Outpatient Medications on File Prior to Visit  Medication Sig Dispense Refill   cycloSPORINE (RESTASIS) 0.05 % ophthalmic emulsion Place 1 drop into both eyes 2 (two) times daily. 5.5 mL 3   Denosumab (PROLIA [place]) Inject into the skin. TAKE EVERY 6 MONTHS     FARXIGA 10 MG TABS tablet Take 10 mg by mouth daily.     furosemide  (LASIX) 40 MG tablet TAKE 1 TABLET BY MOUTH EVERY DAY 90 tablet 1   glucose blood (ONE TOUCH ULTRA TEST) test strip Check glucose 3 times daily 300 each 12   KLOR-CON M10 10 MEQ tablet TAKE 1 TABLET BY MOUTH EVERY DAY 90 tablet 1   levothyroxine (SYNTHROID) 112 MCG tablet TAKE 1 TABLET BY MOUTH EVERY DAY BEFORE BREAKFAST 90 tablet 3   lisinopril (ZESTRIL) 20 MG tablet Take 1.5 tablets (30 mg total) by mouth daily. (Patient taking differently: Take 30 mg by mouth daily. Taking 1 tab daily.) 135 tablet 3   omeprazole (PRILOSEC) 20 MG capsule TAKE 1 CAPSULE BY MOUTH EVERY DAY 90 capsule 1   rosuvastatin (CRESTOR) 20 MG tablet TAKE 1 TABLET BY MOUTH EVERY DAY 90 tablet 1   traZODone (DESYREL) 100 MG tablet TAKE 1/2 TABLET (50 MG TOTAL) BY MOUTH AT BEDTIME. 45 tablet 0   No current facility-administered medications on file prior to visit.    Review of Systems:  As per HPI- otherwise negative.   Physical Examination: Vitals:   07/16/23 0828  BP: 122/80  Pulse: 87  Resp: 18  Temp: 97.7 F (36.5 C)  SpO2: 97%   Vitals:   07/16/23 0828  Weight: 299 lb 6.4 oz (135.8 kg)  Height: 5\' 3"  (1.6 m)   Body mass index is 53.04 kg/m. Ideal Body Weight: Weight in (lb) to have BMI = 25: 140.8  GEN: no acute distress.  Obese, normal weight  HEENT: Atraumatic, Normocephalic.  Ears and Nose: No external deformity. CV: RRR, No M/G/R. No JVD. No thrill. No extra heart sounds. PULM: CTA B, no wheezes, crackles, rhonchi. No retractions. No resp. distress. No accessory muscle use. ABD: S, NT, ND, +BS. No rebound. No HSM. EXTR: No c/c/e PSYCH: Normally interactive. Conversant.  Normal range of motion of her left shoulder.  She has tenderness with palpation over the left upper arm, biceps and triceps distribution.  No redness or swelling, no heat is noted  Assessment and Plan: Controlled type 2 diabetes mellitus without complication, without long-term current use of insulin (HCC) - Plan: Basic  metabolic panel, Hemoglobin A1c  Hypercholesterolemia  Hypothyroidism (acquired)  Immunization due - Plan: Flu Vaccine Trivalent High Dose (Fluad)  Pain in left arm - Plan: DG Humerus Left, DG Shoulder Left Patient seen today for follow-up.  Lab work is pending as above Gave flu shot Thyroid check is up-to-date Gave flu shot Will attempt to find an explanation for her arm pain.  Plain films are pending as above.  I suggested potentially doing physical therapy, she would like to  think about this  Signed Abbe Amsterdam, MD  Received x-rays as below, message to patient  DG Shoulder Left  Result Date: 07/16/2023 CLINICAL DATA:  Left shoulder pain for the past 3 months. EXAM: LEFT SHOULDER - 2+ VIEW COMPARISON:  Left humerus radiographs-07/16/2023 FINDINGS: No fracture or dislocation. Glenohumeral and acromioclavicular joint spaces appear preserved. No evidence of calcific tendinitis. Limited visualization of the adjacent thorax demonstrates atherosclerotic plaque within the aortic arch. Regional soft tissues appear normal. IMPRESSION: 1. No explanation for patient's left shoulder pain. 2. Aortic Atherosclerosis (ICD10-I70.0). Electronically Signed   By: Simonne Come M.D.   On: 07/16/2023 10:13   DG Humerus Left  Result Date: 07/16/2023 CLINICAL DATA:  Left shoulder pain for the past 3 months. EXAM: LEFT HUMERUS - 2+ VIEW COMPARISON:  Left shoulder radiographs-earlier same day FINDINGS: No fracture or dislocation. Limited visualization of the shoulder and elbow is normal given obliquity and large field of view. No definite elbow joint effusion. Regional soft tissues appear normal. Limited visualization of the adjacent thorax is normal. IMPRESSION: No explanation for patient's left shoulder pain. Specifically, no fracture or dislocation. Electronically Signed   By: Simonne Come M.D.   On: 07/16/2023 10:09     Received labs as below, message to pt  Results for orders placed or performed in  visit on 07/16/23  Basic metabolic panel  Result Value Ref Range   Sodium 139 135 - 145 mEq/L   Potassium 4.7 3.5 - 5.1 mEq/L   Chloride 101 96 - 112 mEq/L   CO2 29 19 - 32 mEq/L   Glucose, Bld 149 (H) 70 - 99 mg/dL   BUN 15 6 - 23 mg/dL   Creatinine, Ser 1.61 0.40 - 1.20 mg/dL   GFR 09.60 (L) >45.40 mL/min   Calcium 9.7 8.4 - 10.5 mg/dL  Hemoglobin J8J  Result Value Ref Range   Hgb A1c MFr Bld 6.2 4.6 - 6.5 %

## 2023-07-16 ENCOUNTER — Ambulatory Visit (HOSPITAL_BASED_OUTPATIENT_CLINIC_OR_DEPARTMENT_OTHER)
Admission: RE | Admit: 2023-07-16 | Discharge: 2023-07-16 | Disposition: A | Discharge status: Home / Self Care | Payer: Medicare Other | Source: Ambulatory Visit | Attending: Family Medicine | Admitting: Family Medicine

## 2023-07-16 ENCOUNTER — Encounter: Payer: Self-pay | Admitting: Family Medicine

## 2023-07-16 ENCOUNTER — Ambulatory Visit (INDEPENDENT_AMBULATORY_CARE_PROVIDER_SITE_OTHER): Payer: Medicare Other | Admitting: Family Medicine

## 2023-07-16 VITALS — BP 122/80 | HR 87 | Temp 97.7°F | Resp 18 | Ht 63.0 in | Wt 229.4 lb

## 2023-07-16 DIAGNOSIS — Z23 Encounter for immunization: Secondary | ICD-10-CM | POA: Diagnosis not present

## 2023-07-16 DIAGNOSIS — E119 Type 2 diabetes mellitus without complications: Secondary | ICD-10-CM

## 2023-07-16 DIAGNOSIS — E7800 Pure hypercholesterolemia, unspecified: Secondary | ICD-10-CM | POA: Diagnosis not present

## 2023-07-16 DIAGNOSIS — E039 Hypothyroidism, unspecified: Secondary | ICD-10-CM

## 2023-07-16 DIAGNOSIS — M79602 Pain in left arm: Secondary | ICD-10-CM | POA: Insufficient documentation

## 2023-07-16 DIAGNOSIS — I700 Atherosclerosis of aorta: Secondary | ICD-10-CM | POA: Diagnosis not present

## 2023-07-16 DIAGNOSIS — M25512 Pain in left shoulder: Secondary | ICD-10-CM | POA: Diagnosis not present

## 2023-07-16 LAB — BASIC METABOLIC PANEL
BUN: 15 mg/dL (ref 6–23)
CO2: 29 meq/L (ref 19–32)
Calcium: 9.7 mg/dL (ref 8.4–10.5)
Chloride: 101 meq/L (ref 96–112)
Creatinine, Ser: 1.09 mg/dL (ref 0.40–1.20)
GFR: 47.8 mL/min — ABNORMAL LOW (ref >60.00)
Glucose, Bld: 149 mg/dL — ABNORMAL HIGH (ref 70–99)
Potassium: 4.7 meq/L (ref 3.5–5.1)
Sodium: 139 meq/L (ref 135–145)

## 2023-07-16 LAB — HEMOGLOBIN A1C: Hgb A1c MFr Bld: 6.2 % (ref 4.6–6.5)

## 2023-08-08 ENCOUNTER — Ambulatory Visit
Admission: RE | Admit: 2023-08-08 | Discharge: 2023-08-08 | Disposition: A | Discharge status: Home / Self Care | Payer: Medicare Other | Source: Ambulatory Visit | Attending: Family Medicine | Admitting: Family Medicine

## 2023-08-08 DIAGNOSIS — Z1231 Encounter for screening mammogram for malignant neoplasm of breast: Secondary | ICD-10-CM | POA: Diagnosis not present

## 2023-08-12 ENCOUNTER — Ambulatory Visit: Payer: Medicare Other

## 2023-08-25 ENCOUNTER — Encounter: Payer: Self-pay | Admitting: Family Medicine

## 2023-08-27 ENCOUNTER — Telehealth: Payer: Self-pay | Admitting: Family Medicine

## 2023-08-27 DIAGNOSIS — N1831 Chronic kidney disease, stage 3a: Secondary | ICD-10-CM | POA: Diagnosis not present

## 2023-08-27 DIAGNOSIS — M8580 Other specified disorders of bone density and structure, unspecified site: Secondary | ICD-10-CM | POA: Diagnosis not present

## 2023-08-27 DIAGNOSIS — E1122 Type 2 diabetes mellitus with diabetic chronic kidney disease: Secondary | ICD-10-CM | POA: Diagnosis not present

## 2023-08-27 DIAGNOSIS — I129 Hypertensive chronic kidney disease with stage 1 through stage 4 chronic kidney disease, or unspecified chronic kidney disease: Secondary | ICD-10-CM | POA: Diagnosis not present

## 2023-08-27 DIAGNOSIS — F32A Depression, unspecified: Secondary | ICD-10-CM | POA: Diagnosis not present

## 2023-08-27 NOTE — Telephone Encounter (Signed)
Sandra Hughes from [facility] called and stated they she faxed over a 4 page immune deficiency form on the 21st and 22nd. She stated that she will fax again today. Please advise at [phone].

## 2023-08-27 NOTE — Telephone Encounter (Signed)
Called and told them the Pt did not want this.

## 2023-09-04 ENCOUNTER — Other Ambulatory Visit: Payer: Self-pay | Admitting: Family Medicine

## 2023-09-04 DIAGNOSIS — F32A Depression, unspecified: Secondary | ICD-10-CM

## 2023-09-06 ENCOUNTER — Other Ambulatory Visit: Payer: Self-pay | Admitting: Family Medicine

## 2023-09-06 DIAGNOSIS — I10 Essential (primary) hypertension: Secondary | ICD-10-CM

## 2023-09-08 ENCOUNTER — Other Ambulatory Visit: Payer: Self-pay | Admitting: Family Medicine

## 2023-09-08 DIAGNOSIS — I10 Essential (primary) hypertension: Secondary | ICD-10-CM

## 2023-09-16 MED ORDER — DENOSUMAB 60 MG/ML ~~LOC~~ SOSY
60.0000 mg | PREFILLED_SYRINGE | Freq: Once | SUBCUTANEOUS | Status: AC
  Administered 2023-11-06: 60 mg via SUBCUTANEOUS

## 2023-09-16 NOTE — Telephone Encounter (Signed)
Routing back to [place]:  Pt received last injection on 04/24/23.  Due on/after 10/25/2023 CAM placed.

## 2023-09-16 NOTE — Addendum Note (Signed)
Addended by: CREFT, Melton Alar L on: 09/16/2023 12:12 PM   Modules accepted: Orders

## 2023-09-16 NOTE — Telephone Encounter (Signed)
Feliz Beam called back & stated that the patient expressed to them that she is interested. He stated that he will be sending over another fax.

## 2023-10-27 ENCOUNTER — Telehealth: Payer: Self-pay

## 2023-10-27 NOTE — Telephone Encounter (Signed)
Prolia VOB initiated via [email or web address]  Next Prolia inj DUE: NOW

## 2023-11-03 NOTE — Telephone Encounter (Signed)
 Marland Kitchen

## 2023-11-03 NOTE — Telephone Encounter (Signed)
Pt ready for scheduling for PROLIA on or after : 11/03/23   Out-of-pocket cost due at time of visit: $0   Number of injection/visits approved: ---   Primary: MEDICARE Prolia co-insurance: 0% Admin fee co-insurance: 0%   Secondary: [place] MEDICAID Prolia co-insurance: patient is enrolled in the Qualified Medicare Beneficiary (QMB) program and will not be subject to deductible or coinsurance for the administration or cost of this medication Admin fee co-insurance:    Medical Benefit Details: Date Benefits were checked: 11/03/23 Deductible: NO/ Coinsurance: 0%/ Admin Fee: 0%   Prior Auth: N/A PA# Expiration Date:   # of doses approved:   Pharmacy benefit: Copay $--- If patient wants fill through the pharmacy benefit please send prescription to:  --- , and include estimated need by date in rx notes. Pharmacy will ship medication directly to the office.   Patient NOT eligible for Prolia Copay Card. Copay Card can make patient's cost as little as $25. Link to apply: [email or web address]://[email or web address].[email or web address]   ** This summary of benefits is an estimation of the patient's out-of-pocket cost. Exact cost may very based on individual plan coverage

## 2023-11-04 NOTE — Telephone Encounter (Signed)
 Pt ready for scheduling for PROLIA on or after : 11/03/23   Out-of-pocket cost due at time of visit: $0   Number of injection/visits approved: ---   Primary: MEDICARE Prolia co-insurance: 0% Admin fee co-insurance: 0%   Secondary: [place] MEDICAID Prolia co-insurance: patient is enrolled in the Qualified Medicare Beneficiary (QMB) program and will not be subject to deductible or coinsurance for the administration or cost of this medication Admin fee co-insurance:    Medical Benefit Details: Date Benefits were checked: 11/03/23 Deductible: NO/ Coinsurance: 0%/ Admin Fee: 0%   Prior Auth: N/A PA# Expiration Date:   # of doses approved:   Pharmacy benefit: Copay $--- If patient wants fill through the pharmacy benefit please send prescription to:  --- , and include estimated need by date in rx notes. Pharmacy will ship medication directly to the office.   Patient NOT eligible for Prolia Copay Card. Copay Card can make patient's cost as little as $25. Link to apply: [email or web address]://[email or web address].[email or web address]   ** This summary of benefits is an estimation of the patient's out-of-pocket cost. Exact cost may very based on individual plan coverage

## 2023-11-04 NOTE — Telephone Encounter (Signed)
 Pt scheduled for 11/06/23

## 2023-11-06 ENCOUNTER — Telehealth: Payer: Self-pay

## 2023-11-06 ENCOUNTER — Ambulatory Visit: Payer: Medicare Other

## 2023-11-06 ENCOUNTER — Other Ambulatory Visit: Payer: Self-pay

## 2023-11-06 DIAGNOSIS — M810 Age-related osteoporosis without current pathological fracture: Secondary | ICD-10-CM

## 2023-11-06 MED ORDER — DENOSUMAB 60 MG/ML ~~LOC~~ SOSY
60.0000 mg | PREFILLED_SYRINGE | Freq: Once | SUBCUTANEOUS | Status: AC
  Administered 2024-05-05: 60 mg via SUBCUTANEOUS

## 2023-11-06 NOTE — Telephone Encounter (Signed)
 Prolia  was done 11/06/23 Next is due 05/05/24 CAM placed.

## 2023-11-06 NOTE — Telephone Encounter (Signed)
 Pt was in office today for prolia  injection, pt tolerated well. Please contact pt to schedule next injection.

## 2023-11-06 NOTE — Progress Notes (Signed)
 Pt is office today for Prolia  injection, injection was given subcutaneous in L arm. Pt tolerated well.

## 2023-11-30 ENCOUNTER — Other Ambulatory Visit: Payer: Self-pay | Admitting: Family Medicine

## 2023-11-30 DIAGNOSIS — F32A Depression, unspecified: Secondary | ICD-10-CM

## 2023-11-30 DIAGNOSIS — E039 Hypothyroidism, unspecified: Secondary | ICD-10-CM

## 2023-12-07 ENCOUNTER — Other Ambulatory Visit: Payer: Self-pay | Admitting: Family Medicine

## 2023-12-07 DIAGNOSIS — K219 Gastro-esophageal reflux disease without esophagitis: Secondary | ICD-10-CM

## 2023-12-23 ENCOUNTER — Other Ambulatory Visit: Payer: Self-pay | Admitting: Family Medicine

## 2023-12-23 DIAGNOSIS — I10 Essential (primary) hypertension: Secondary | ICD-10-CM

## 2023-12-23 DIAGNOSIS — F32A Depression, unspecified: Secondary | ICD-10-CM

## 2023-12-23 DIAGNOSIS — E039 Hypothyroidism, unspecified: Secondary | ICD-10-CM

## 2023-12-23 DIAGNOSIS — K219 Gastro-esophageal reflux disease without esophagitis: Secondary | ICD-10-CM

## 2023-12-23 NOTE — Telephone Encounter (Signed)
 Copied from CRM [id number]. Topic: Clinical - Medication Refill >> Dec 23, 2023 12:49 PM Sandra Hughes wrote: Most Recent Primary Care Visit:  Provider: Judieth Keens  Department: [facility]  Visit Type: CLINICAL SUPPORT  Date: 11/06/2023  Medication: furosemide (LASIX) 40 MG tablet, needs to be reduced to a 1/2 tab a day by her Kidney Specialist at [facility] KLOR-CON M10 10 MEQ tablet levothyroxine (SYNTHROID) 112 MCG tablet lisinopril (ZESTRIL) 20 MG tablet  omeprazole (PRILOSEC) 20 MG capsule rosuvastatin (CRESTOR) 20 MG tablet traZODone (DESYREL) 100 MG tablet  Has the patient contacted their pharmacy? Yes, needs all new rxs as [facility] is [facility].  Is this the correct pharmacy for this prescription? Yes If no, delete pharmacy and type the correct one.  This is the patient's preferred pharmacy:  [id number] [place], [place] - [address] AT [facility] [address] [place] [address] Phone: [phone] Fax: [phone]   Has the prescription been filled recently? Yes  Is the patient out of the medication? No  Has the patient been seen for an appointment in the last year OR does the patient have an upcoming appointment? Yes  Can we respond through MyChart? Yes  Agent: Please be advised that Rx refills may take up to 3 business days. We ask that you follow-up with your pharmacy.

## 2023-12-26 ENCOUNTER — Telehealth: Payer: Self-pay

## 2023-12-26 NOTE — Telephone Encounter (Signed)
 Copied from CRM [id number]. Topic: Clinical - Request for Lab/Test Order >> Dec 26, 2023 11:01 AM Marica Otter wrote: Reason for CRM: Lauren with [facility] calling in regards to a req/order faxed to the office on 11/05/2023 for an at home immunodeficiency test for patient. Lauren states she will fax be faxing order back to office today to have provider sign off on.  Lauren [facility] [phone] Ext [phone]

## 2023-12-29 ENCOUNTER — Other Ambulatory Visit: Payer: Self-pay | Admitting: Family Medicine

## 2023-12-29 DIAGNOSIS — E039 Hypothyroidism, unspecified: Secondary | ICD-10-CM

## 2023-12-29 DIAGNOSIS — F32A Depression, unspecified: Secondary | ICD-10-CM

## 2023-12-29 DIAGNOSIS — K219 Gastro-esophageal reflux disease without esophagitis: Secondary | ICD-10-CM

## 2023-12-29 DIAGNOSIS — I10 Essential (primary) hypertension: Secondary | ICD-10-CM

## 2023-12-29 DIAGNOSIS — E7800 Pure hypercholesterolemia, unspecified: Secondary | ICD-10-CM

## 2023-12-29 MED ORDER — FUROSEMIDE 40 MG PO TABS: 40.0000 mg | ORAL_TABLET | Freq: Every day | ORAL | 1 refills | Status: AC

## 2023-12-29 MED ORDER — LEVOTHYROXINE SODIUM 112 MCG PO TABS: 112.0000 ug | ORAL_TABLET | Freq: Every day | ORAL | 3 refills | Status: DC

## 2023-12-29 MED ORDER — LISINOPRIL 20 MG PO TABS: 30.0000 mg | ORAL_TABLET | Freq: Every day | ORAL | 3 refills | Status: DC

## 2023-12-29 MED ORDER — POTASSIUM CHLORIDE CRYS ER 10 MEQ PO TBCR: 10.0000 meq | EXTENDED_RELEASE_TABLET | Freq: Every day | ORAL | 1 refills | Status: DC

## 2023-12-29 MED ORDER — OMEPRAZOLE 20 MG PO CPDR: 20.0000 mg | DELAYED_RELEASE_CAPSULE | Freq: Every day | ORAL | 3 refills | Status: DC

## 2023-12-29 MED ORDER — TRAZODONE HCL 100 MG PO TABS: 50.0000 mg | ORAL_TABLET | Freq: Every day | ORAL | 3 refills | Status: DC

## 2023-12-29 MED ORDER — ROSUVASTATIN CALCIUM 20 MG PO TABS: 20.0000 mg | ORAL_TABLET | Freq: Every day | ORAL | 3 refills | Status: DC

## 2024-02-20 DIAGNOSIS — I129 Hypertensive chronic kidney disease with stage 1 through stage 4 chronic kidney disease, or unspecified chronic kidney disease: Secondary | ICD-10-CM | POA: Diagnosis not present

## 2024-02-20 DIAGNOSIS — E1122 Type 2 diabetes mellitus with diabetic chronic kidney disease: Secondary | ICD-10-CM | POA: Diagnosis not present

## 2024-02-20 DIAGNOSIS — N1831 Chronic kidney disease, stage 3a: Secondary | ICD-10-CM | POA: Diagnosis not present

## 2024-02-20 DIAGNOSIS — F32A Depression, unspecified: Secondary | ICD-10-CM | POA: Diagnosis not present

## 2024-02-20 DIAGNOSIS — M8580 Other specified disorders of bone density and structure, unspecified site: Secondary | ICD-10-CM | POA: Diagnosis not present

## 2024-02-20 LAB — BASIC METABOLIC PANEL WITH GFR
BUN: 19 (ref 4–21)
Creatinine: 1.1 (ref 0.5–1.1)
Potassium: 5 meq/L (ref 3.5–5.1)
Sodium: 139 (ref 137–147)

## 2024-02-20 LAB — COMPREHENSIVE METABOLIC PANEL WITH GFR: eGFR: 51

## 2024-03-16 DIAGNOSIS — N1831 Chronic kidney disease, stage 3a: Secondary | ICD-10-CM | POA: Diagnosis not present

## 2024-04-04 ENCOUNTER — Other Ambulatory Visit: Payer: Self-pay | Admitting: Family Medicine

## 2024-04-04 DIAGNOSIS — I10 Essential (primary) hypertension: Secondary | ICD-10-CM

## 2024-04-05 ENCOUNTER — Telehealth: Payer: Self-pay

## 2024-04-05 NOTE — Telephone Encounter (Signed)
 Prolia  VOB initiated via [email or web address]  Next Prolia  inj DUE: 05/05/24

## 2024-04-05 NOTE — Telephone Encounter (Signed)
 Pt ready for scheduling for PROLIA  on or after : 05/05/24  Option# 1: Buy/Bill (Office supplied medication)  Out-of-pocket cost due at time of clinic visit: $0  Number of injection/visits approved: ---  Primary: MEDICARE Prolia  co-insurance: 0% Admin fee co-insurance: 0%  Secondary: MEDICAID Prolia  co-insurance:  Admin fee co-insurance:   Medical Benefit Details: Date Benefits were checked: 04/05/24 Deductible: NO/ Coinsurance: 0%/ Admin Fee: 0%  Prior Auth: N/A PA# Expiration Date:   # of doses approved: ----------------------------------------------------------------------- Option# 2- Med Obtained from pharmacy:  Pharmacy benefit: Copay $--- (Paid to pharmacy) Admin Fee: --- (Pay at clinic)  Prior Auth: --- PA# Expiration Date:   # of doses approved:   If patient wants fill through the pharmacy benefit please send prescription to: ---, and include estimated need by date in rx notes. Pharmacy will ship medication directly to the office.  Patient NOT eligible for Prolia  Copay Card. Copay Card can make patient's cost as little as $25. Link to apply: [email or web address]://[email or web address]  ** This summary of benefits is an estimation of the patient's out-of-pocket cost. Exact cost may very based on individual plan coverage.

## 2024-04-05 NOTE — Telephone Encounter (Signed)
 [name]

## 2024-05-03 ENCOUNTER — Telehealth: Payer: Self-pay | Admitting: Family Medicine

## 2024-05-03 NOTE — Telephone Encounter (Signed)
 Copied from CRM [id number]. Topic: General - Other >> Apr 28, 2024  9:39 AM Jayma L wrote: Reason for CRM: charles from [facility] called stated he sent a 4 page fax on 04/21/2024 asking if its been received, advised if he wanted he can resend it to fax number [phone]- said he will resend it , with most recent labs   Carlin personal number [phone] ext [phone] >> May 03, 2024  4:07 PM Tiffany B wrote: Carlin from [facility] checking on the status of immune deficiency form faxed on 7/23, caller re faxing forms to [phone], please notate when form is received

## 2024-05-05 ENCOUNTER — Ambulatory Visit (INDEPENDENT_AMBULATORY_CARE_PROVIDER_SITE_OTHER)

## 2024-05-05 DIAGNOSIS — M810 Age-related osteoporosis without current pathological fracture: Secondary | ICD-10-CM

## 2024-05-05 MED ORDER — DENOSUMAB 60 MG/ML ~~LOC~~ SOSY: 60.0000 mg | PREFILLED_SYRINGE | SUBCUTANEOUS | Status: AC

## 2024-05-05 NOTE — Progress Notes (Signed)
 Pt was in office today for a Prolia  injection, injection was gien subcutaneous in R arm. Pt tolerated well.

## 2024-05-06 ENCOUNTER — Telehealth: Payer: Self-pay

## 2024-05-06 DIAGNOSIS — M810 Age-related osteoporosis without current pathological fracture: Secondary | ICD-10-CM

## 2024-05-06 NOTE — Telephone Encounter (Signed)
 Pt was in office 05/05/2024 for her prolia  injection, pt states PCP advised her she is due for her next bone density scan. Advised pt a message would be sent back to PCP.

## 2024-05-06 NOTE — Addendum Note (Signed)
 Addended by: WATT RAISIN C on: 05/06/2024 06:59 PM   Modules accepted: Orders

## 2024-05-17 ENCOUNTER — Telehealth: Payer: Self-pay | Admitting: Family Medicine

## 2024-05-17 NOTE — Telephone Encounter (Signed)
 Copied from CRM #[id number]. Topic: General - Other >> May 17, 2024  2:19 PM Mercedes MATSU wrote: Reason for CRM: carlin from [facility] called stated he sent a 4 page fax on 04/21/2024 asking if its been received, advised if he wanted he can resend it to fax number [phone]- said he will resend it , with most recent labs    carlin personal number [phone] ext [phone]

## 2024-05-17 NOTE — Telephone Encounter (Signed)
 If I recall this is an unwanted commercial solicitation/scam.  I believe patient and I have discussed this previously and she is not interested.  I will look over paperwork when it arrives and call patient back to ask again if needed

## 2024-05-25 ENCOUNTER — Telehealth: Payer: Self-pay | Admitting: Family Medicine

## 2024-05-25 NOTE — Telephone Encounter (Signed)
 Copied from CRM #[id number]. Topic: General - Other >> May 25, 2024  2:05 PM Martinique E wrote: Reason for CRM: Patient had her diabetic eye exam done at [facility] and in order for them to fax the progress notes from this visit, patient stated her PCP's office would have to call them at [phone] and request them.

## 2024-06-03 NOTE — Telephone Encounter (Signed)
 Patient care continue care forms faxed to   [phone]

## 2024-06-04 ENCOUNTER — Ambulatory Visit: Payer: Self-pay

## 2024-06-04 NOTE — Telephone Encounter (Signed)
 Call dropped on transfer from [facility].  First attempt to call patient: LVM for return call to [phone]   Copied from CRM #[id number]. Topic: Clinical - Red Word Triage >> Jun 04, 2024  2:42 PM Deaijah H wrote: Red Word that prompted transfer to Nurse Triage: Med check / Pain all over 24/7 & currently / believes she need flu shot

## 2024-06-04 NOTE — Telephone Encounter (Signed)
 Noted

## 2024-06-04 NOTE — Telephone Encounter (Signed)
Unable to reach pt x3 attempts 

## 2024-06-12 NOTE — Progress Notes (Signed)
 [facility] [address], [address] [place], [place] [address] [phone]  Date:  06/16/2024   Name:  Sandra Hughes   DOB:  [birth year]   MRN:  [id number]  PCP:  Watt Harlene BROCKS, MD    Chief Complaint: Follow-up (No concerns /Flu shot)   History of Present Illness:  Sandra Hughes is a 83 y.o. very pleasant female patient who presents with the following:  Patient seen today for periodic follow-up, medication monitor I saw her most recently a year ago -history of diabetes, hypothyroidism, hypertension, osteopenia, hyperlipidemia, vitamin D deficiency, depression, chronic back pain, edema for which she uses furosemide , chronic renal insufficiency    She is followed by nephrology, Dr. Gearline -most recent visit in May  Due for A1c, urine micro Eye exam- [facility] Flu shot- give today  Recommend COVID booster this fall Can update foot exam  mammogram good until November DEXA scan can be updated-she is now using Prolia .  Most recent shot in August- scheduled for next week   Prolia  Farxiga 10 Lasix  40 mg daily/potassium 10 mill equivalents- she is taking a 1/2 of each every other day Lisinopril  20 Omeprazole  Levothyroxine  112 mcg Crestor  Trazodone   Discussed the use of AI scribe software for clinical note transcription with the patient, who gave verbal consent to proceed.  History of Present Illness Sandra Hughes is an 83 year old female who presents with worsening hip and knee pain.  She experiences constant bilateral hip pain, describing it as similar to the pain of an ingrown toenail. She reports that her pain makes it very hard for her to get up and down the stairs, and it has been especially hard over the last three days. Her right knee also causes significant discomfort, making it challenging to ascend stairs, necessitating the use of a cane and railing for support. The pain episodes last for four to  five days and are often triggered by inclement weather.  She is not currently taking any medication for pain but is considering restarting gabapentin . She avoids aspirin due to an allergy and is hesitant to take other medications like acetaminophen due to concerns about gastrointestinal issues.  She reports significant gastrointestinal discomfort, including gas, stomach pains, and nausea, which she associates with her morning medications, particularly her thyroid  medication. Symptoms worsen after taking her morning pills and eating breakfast. Her bowel movements are not hard but sometimes loose, affecting her ability to go out in public.  She has a history of kidney issues and was advised to reduce potassium intake, opting to cut her potassium tablets in half and take them every other day. She mentions a recent issue with her kidney test results, which indicated high levels of either potassium or protein, but she has not received further communication from her kidney specialist.     Patient Active Problem List   Diagnosis Date Noted   Pulmonary nodule 11/21/2016   HTN (hypertension) 05/15/2012   Edema 05/15/2012   Allergic rhinitis 05/15/2012   Controlled type 2 diabetes mellitus without complication, without long-term current use of insulin (HCC) 05/15/2012   Osteopenia 05/15/2012   Depression 05/15/2012   Hypothyroidism 05/15/2012   GERD (gastroesophageal reflux disease) 05/15/2012   Hypercholesterolemia 05/15/2012   Vitamin D deficiency 05/15/2012    Past Medical History:  Diagnosis Date   Allergy    Anemia    Anxiety    Arthritis    Asthma  AS A CHILD   Cataract    Depression    Diabetes mellitus without complication (HCC)    GERD (gastroesophageal reflux disease)    Hyperlipidemia    Hypertension    Neuromuscular disorder (HCC)    NEUROPATHY,MILD EARY STAGE   Osteoporosis    Thyroid  disease     Past Surgical History:  Procedure Laterality Date   ABDOMINAL  HYSTERECTOMY     no cancer   CHOLECYSTECTOMY     COLONOSCOPY     POLYPECTOMY     UPPER GASTROINTESTINAL ENDOSCOPY      Social History   Tobacco Use   Smoking status: Never    Passive exposure: Past (FATHER SMOKED)   Smokeless tobacco: Never  Vaping Use   Vaping status: Never Used  Substance Use Topics   Alcohol use: No   Drug use: No    Family History  Problem Relation Age of Onset   Hypertension Mother    COPD Father    Colon polyps Father    Arthritis Sister    Diabetes Sister    Diabetes Brother    Cancer Maternal Grandfather        skin cancer   Heart disease Paternal Grandfather    Colon cancer Neg Hx    Esophageal cancer Neg Hx    Rectal cancer Neg Hx    Stomach cancer Neg Hx    Crohn's disease Neg Hx    Ulcerative colitis Neg Hx     Allergies  Allergen Reactions   Aspirin Anaphylaxis   Alpha-Lipoic Acid Other (See Comments)    Skin blisters    Medication list has been reviewed and updated.  Current Outpatient Medications on File Prior to Visit  Medication Sig Dispense Refill   cycloSPORINE  (RESTASIS ) 0.05 % ophthalmic emulsion Place 1 drop into both eyes 2 (two) times daily. 5.5 mL 3   Denosumab  (PROLIA  [place]) Inject into the skin. TAKE EVERY 6 MONTHS     FARXIGA 10 MG TABS tablet Take 10 mg by mouth daily.     furosemide  (LASIX ) 40 MG tablet Take 1 tablet (40 mg total) by mouth daily. (Patient taking differently: Take 40 mg by mouth. Half a tablet every other day) 90 tablet 1   glucose blood (ONE TOUCH ULTRA TEST) test strip Check glucose 3 times daily 300 each 12   levothyroxine  (SYNTHROID ) 112 MCG tablet Take 1 tablet (112 mcg total) by mouth daily before breakfast. 90 tablet 3   lisinopril  (ZESTRIL ) 20 MG tablet Take 1.5 tablets (30 mg total) by mouth daily. 135 tablet 3   omeprazole  (PRILOSEC) 20 MG capsule Take 1 capsule (20 mg total) by mouth daily. 90 capsule 3   potassium chloride  (KLOR-CON  M) 10 MEQ tablet Take 1 tablet (10 mEq total) by mouth  daily. Needs appt 30 tablet 0   rosuvastatin  (CRESTOR ) 20 MG tablet Take 1 tablet (20 mg total) by mouth daily. 90 tablet 3   traZODone  (DESYREL ) 100 MG tablet Take 0.5 tablets (50 mg total) by mouth at bedtime. 45 tablet 3   Current Facility-Administered Medications on File Prior to Visit  Medication Dose Route Frequency Provider Last Rate Last Admin   [START ON 11/01/2024] denosumab  (PROLIA ) injection 60 mg  60 mg Subcutaneous Q6 months Shavone Nevers, Harlene BROCKS, MD        Review of Systems:  As per HPI- otherwise negative.   Physical Examination: Vitals:   06/16/24 1355  BP: 118/72  Pulse: 67  SpO2: 98%   Vitals:  06/16/24 1355  Weight: 226 lb 6.4 oz (102.7 kg)  Height: 5' 3 (1.6 m)   Body mass index is 40.1 kg/m. Ideal Body Weight: Weight in (lb) to have BMI = 25: 140.8  GEN: no acute distress. Obese, looks well  HEENT: Atraumatic, Normocephalic.  Ears and Nose: No external deformity. CV: RRR, No M/G/R. No JVD. No thrill. No extra heart sounds. PULM: CTA B, no wheezes, crackles, rhonchi. No retractions. No resp. distress. No accessory muscle use. ABD: S, NT, ND, +BS. No rebound. No HSM. EXTR: No c/c/e PSYCH: Normally interactive. Conversant.  Sebacous material under superficial skin left upper back- removed for patient Right knee display stiffness, crepitus   Assessment and Plan: Essential hypertension, benign - Plan: CBC, Comprehensive metabolic panel with GFR  Hypothyroidism (acquired) - Plan: TSH  Controlled type 2 diabetes mellitus without complication, without long-term current use of insulin (HCC) - Plan: Comprehensive metabolic panel with GFR, Hemoglobin A1c  Hypercholesterolemia - Plan: Lipid panel  Need for influenza vaccination - Plan: Flu vaccine HIGH DOSE PF(Fluzone Trivalent)  Chronic joint pain Blood pressure well-controlled on current regimen She is taking Lasix  10 mg every other day-to go with that she is using half of a 10 mill equivalents Klor-Con   every other day.  Advised her we can likely just stop potassium since this is such a small amount-we will get her level Assessment & Plan Chronic bilateral hip and knee pain Chronic pain with exacerbations, worsened by weather. Declines orthopedic evaluation and knee injections. - Restart gabapentin  for pain management. - Use acetaminophen as directed for pain. - Discussed orthopedic evaluation and knee injections; she declined.  Chronic gastrointestinal symptoms Symptoms potentially related to medication intake, particularly levothyroxine  on an empty stomach. - Take levothyroxine  at bedtime instead of morning. - Monitor gastrointestinal symptoms with adjusted timing.  Hypothyroidism Managed with levothyroxine , contributing to gastrointestinal symptoms when taken on an empty stomach. - Take levothyroxine  at bedtime to reduce gastrointestinal symptoms.  Signed Harlene Schroeder, MD  Received her labs, message as below Results for orders placed or performed in visit on 06/16/24  CBC   Collection Time: 06/16/24  2:31 PM  Result Value Ref Range   WBC 6.8 4.0 - 10.5 K/uL   RBC 5.31 (H) 3.87 - 5.11 Mil/uL   Platelets 306.0 150.0 - 400.0 K/uL   Hemoglobin 16.2 (H) 12.0 - 15.0 g/dL   HCT 51.3 (H) 63.9 - 53.9 %   MCV 91.4 78.0 - 100.0 fl   MCHC 33.3 30.0 - 36.0 g/dL   RDW 85.1 88.4 - 84.4 %  Comprehensive metabolic panel with GFR   Collection Time: 06/16/24  2:31 PM  Result Value Ref Range   Sodium 139 135 - 145 mEq/L   Potassium 4.7 3.5 - 5.1 mEq/L   Chloride 100 96 - 112 mEq/L   CO2 28 19 - 32 mEq/L   Glucose, Bld 122 (H) 70 - 99 mg/dL   BUN 18 6 - 23 mg/dL   Creatinine, Ser 8.84 0.40 - 1.20 mg/dL   Total Bilirubin 0.4 0.2 - 1.2 mg/dL   Alkaline Phosphatase 54 39 - 117 U/L   AST 18 0 - 37 U/L   ALT 12 0 - 35 U/L   Total Protein 7.4 6.0 - 8.3 g/dL   Albumin 4.5 3.5 - 5.2 g/dL   GFR 55.45 (L) >39.99 mL/min   Calcium  9.9 8.4 - 10.5 mg/dL  Hemoglobin J8r   Collection Time:  06/16/24  2:31 PM  Result Value Ref  Range   Hgb A1c MFr Bld 6.4 4.6 - 6.5 %  Lipid panel   Collection Time: 06/16/24  2:31 PM  Result Value Ref Range   Cholesterol 174 0 - 200 mg/dL   Triglycerides 874.9 0.0 - 149.0 mg/dL   HDL 22.59 >60.99 mg/dL   VLDL 74.9 0.0 - 59.9 mg/dL   LDL Cholesterol 72 0 - 99 mg/dL   Total CHOL/HDL Ratio 2    NonHDL 97.06   TSH   Collection Time: 06/16/24  2:31 PM  Result Value Ref Range   TSH 0.69 0.35 - 5.50 uIU/mL

## 2024-06-12 NOTE — Patient Instructions (Addendum)
 It was good to see you again today!  Flu shot today Recommend covid booster this fall and a dose of RSV

## 2024-06-14 ENCOUNTER — Telehealth: Payer: Self-pay | Admitting: Family Medicine

## 2024-06-14 NOTE — Telephone Encounter (Signed)
 Copied from CRM [id number]. Topic: General - Other >> Jun 14, 2024 12:33 PM Taleah C wrote: Reason for CRM: Alm from [facility] called to confirm the patients last OV. He asked for the clinic to send over the last OV notes along with the physician's signature. He stated that this is urgent because they have to submit a claim for an audit protocol. They will be faxing a request today and provided Their fax number at [phone]

## 2024-06-14 NOTE — Telephone Encounter (Signed)
 Waiting on fax

## 2024-06-16 ENCOUNTER — Ambulatory Visit (INDEPENDENT_AMBULATORY_CARE_PROVIDER_SITE_OTHER): Admitting: Family Medicine

## 2024-06-16 ENCOUNTER — Encounter: Payer: Self-pay | Admitting: Family Medicine

## 2024-06-16 VITALS — BP 118/72 | HR 67 | Ht 63.0 in | Wt 226.4 lb

## 2024-06-16 DIAGNOSIS — I10 Essential (primary) hypertension: Secondary | ICD-10-CM | POA: Diagnosis not present

## 2024-06-16 DIAGNOSIS — E039 Hypothyroidism, unspecified: Secondary | ICD-10-CM

## 2024-06-16 DIAGNOSIS — M2550 Pain in unspecified joint: Secondary | ICD-10-CM | POA: Diagnosis not present

## 2024-06-16 DIAGNOSIS — E119 Type 2 diabetes mellitus without complications: Secondary | ICD-10-CM | POA: Diagnosis not present

## 2024-06-16 DIAGNOSIS — E7800 Pure hypercholesterolemia, unspecified: Secondary | ICD-10-CM

## 2024-06-16 DIAGNOSIS — G8929 Other chronic pain: Secondary | ICD-10-CM

## 2024-06-16 DIAGNOSIS — Z23 Encounter for immunization: Secondary | ICD-10-CM | POA: Diagnosis not present

## 2024-06-17 ENCOUNTER — Encounter: Payer: Self-pay | Admitting: Family Medicine

## 2024-06-17 LAB — LIPID PANEL
Cholesterol: 174 mg/dL (ref 0–200)
HDL: 77.4 mg/dL (ref >39.00)
LDL Cholesterol: 72 mg/dL (ref 0–99)
NonHDL: 97.06
Total CHOL/HDL Ratio: 2
Triglycerides: 125 mg/dL (ref 0.0–149.0)
VLDL: 25 mg/dL (ref 0.0–40.0)

## 2024-06-17 LAB — COMPREHENSIVE METABOLIC PANEL WITH GFR
ALT: 12 U/L (ref 0–35)
AST: 18 U/L (ref 0–37)
Albumin: 4.5 g/dL (ref 3.5–5.2)
Alkaline Phosphatase: 54 U/L (ref 39–117)
BUN: 18 mg/dL (ref 6–23)
CO2: 28 meq/L (ref 19–32)
Calcium: 9.9 mg/dL (ref 8.4–10.5)
Chloride: 100 meq/L (ref 96–112)
Creatinine, Ser: 1.15 mg/dL (ref 0.40–1.20)
GFR: 44.54 mL/min — ABNORMAL LOW (ref >60.00)
Glucose, Bld: 122 mg/dL — ABNORMAL HIGH (ref 70–99)
Potassium: 4.7 meq/L (ref 3.5–5.1)
Sodium: 139 meq/L (ref 135–145)
Total Bilirubin: 0.4 mg/dL (ref 0.2–1.2)
Total Protein: 7.4 g/dL (ref 6.0–8.3)

## 2024-06-17 LAB — CBC
HCT: 48.6 % — ABNORMAL HIGH (ref 36.0–46.0)
Hemoglobin: 16.2 g/dL — ABNORMAL HIGH (ref 12.0–15.0)
MCHC: 33.3 g/dL (ref 30.0–36.0)
MCV: 91.4 fl (ref 78.0–100.0)
Platelets: 306 K/uL (ref 150.0–400.0)
RBC: 5.31 Mil/uL — ABNORMAL HIGH (ref 3.87–5.11)
RDW: 14.8 % (ref 11.5–15.5)
WBC: 6.8 K/uL (ref 4.0–10.5)

## 2024-06-17 LAB — HEMOGLOBIN A1C: Hgb A1c MFr Bld: 6.4 % (ref 4.6–6.5)

## 2024-06-17 LAB — TSH: TSH: 0.69 u[IU]/mL (ref 0.35–5.50)

## 2024-06-21 ENCOUNTER — Ambulatory Visit (HOSPITAL_BASED_OUTPATIENT_CLINIC_OR_DEPARTMENT_OTHER)
Admission: RE | Admit: 2024-06-21 | Discharge: 2024-06-21 | Disposition: A | Discharge status: Home / Self Care | Source: Ambulatory Visit | Attending: Family Medicine | Admitting: Family Medicine

## 2024-06-21 ENCOUNTER — Encounter: Payer: Self-pay | Admitting: Family Medicine

## 2024-06-21 DIAGNOSIS — M810 Age-related osteoporosis without current pathological fracture: Secondary | ICD-10-CM | POA: Diagnosis not present

## 2024-06-21 DIAGNOSIS — M8589 Other specified disorders of bone density and structure, multiple sites: Secondary | ICD-10-CM | POA: Diagnosis not present

## 2024-06-21 DIAGNOSIS — Z780 Asymptomatic menopausal state: Secondary | ICD-10-CM | POA: Diagnosis not present

## 2024-06-28 DIAGNOSIS — Z23 Encounter for immunization: Secondary | ICD-10-CM | POA: Diagnosis not present

## 2024-08-05 ENCOUNTER — Telehealth: Payer: Self-pay

## 2024-08-05 NOTE — Telephone Encounter (Signed)
  Copied from CRM #[id number]. Topic: General - Other >> Aug 05, 2024  1:40 PM Tysheama G wrote: Reason for CRM: Alm from [employer] calling regarding patient last visit, is asking for Dr.Copland to fax over patient last visit on 9/17 for audit purposes for her insurance. Fax number [phone]

## 2024-08-05 NOTE — Telephone Encounter (Signed)
 PTS last visit notes faxed to [facility] with [facility]

## 2024-08-18 DIAGNOSIS — M8580 Other specified disorders of bone density and structure, unspecified site: Secondary | ICD-10-CM | POA: Diagnosis not present

## 2024-08-18 DIAGNOSIS — E1122 Type 2 diabetes mellitus with diabetic chronic kidney disease: Secondary | ICD-10-CM | POA: Diagnosis not present

## 2024-08-18 DIAGNOSIS — I129 Hypertensive chronic kidney disease with stage 1 through stage 4 chronic kidney disease, or unspecified chronic kidney disease: Secondary | ICD-10-CM | POA: Diagnosis not present

## 2024-08-18 DIAGNOSIS — N1831 Chronic kidney disease, stage 3a: Secondary | ICD-10-CM | POA: Diagnosis not present

## 2024-09-29 ENCOUNTER — Other Ambulatory Visit: Payer: Self-pay | Admitting: Family Medicine

## 2024-09-29 DIAGNOSIS — K219 Gastro-esophageal reflux disease without esophagitis: Secondary | ICD-10-CM

## 2024-09-29 DIAGNOSIS — E7800 Pure hypercholesterolemia, unspecified: Secondary | ICD-10-CM

## 2024-09-29 DIAGNOSIS — F32A Depression, unspecified: Secondary | ICD-10-CM

## 2024-09-29 DIAGNOSIS — E039 Hypothyroidism, unspecified: Secondary | ICD-10-CM

## 2024-09-29 DIAGNOSIS — I10 Essential (primary) hypertension: Secondary | ICD-10-CM

## 2024-10-11 ENCOUNTER — Other Ambulatory Visit: Payer: Self-pay | Admitting: Family Medicine

## 2024-10-11 DIAGNOSIS — Z1231 Encounter for screening mammogram for malignant neoplasm of breast: Secondary | ICD-10-CM

## 2024-10-12 ENCOUNTER — Telehealth: Payer: Self-pay

## 2024-10-12 NOTE — Telephone Encounter (Signed)
 Prolia  VOB initiated via [email or web address]  Next Prolia  inj DUE: 11/05/24

## 2024-10-13 NOTE — Telephone Encounter (Signed)
 [name]

## 2024-10-13 NOTE — Telephone Encounter (Signed)
 Pt ready for scheduling for PROLIA  on or after : 11/05/24  Option# 1: Buy/Bill (Office supplied medication)  Out-of-pocket cost due at time of clinic visit: $0  Number of injection/visits approved: ---  Primary: MEDICARE Prolia  co-insurance: 0% Admin fee co-insurance: 0%  Secondary: [place] MEDICAID Prolia  co-insurance:  Admin fee co-insurance:   Medical Benefit Details: Date Benefits were checked: 10/12/24 Deductible: NO/ Coinsurance: 0%/ Admin Fee: 0%  Prior Auth: N/A PA# Expiration Date:   # of doses approved: ----------------------------------------------------------------------- Option# 2- Med Obtained from pharmacy:  Pharmacy benefit: Copay $--- (Paid to pharmacy) Admin Fee: --- (Pay at clinic)  Prior Auth: --- PA# Expiration Date:   # of doses approved:   If patient wants fill through the pharmacy benefit please send prescription to: ---, and include estimated need by date in rx notes. Pharmacy will ship medication directly to the office.  Patient NOT eligible for Prolia  Copay Card. Copay Card can make patient's cost as little as $25. Link to apply: [email or web address]://[email or web address]  ** This summary of benefits is an estimation of the patient's out-of-pocket cost. Exact cost may very based on individual plan coverage.

## 2024-10-28 ENCOUNTER — Ambulatory Visit

## 2024-11-03 ENCOUNTER — Ambulatory Visit
Admission: RE | Admit: 2024-11-03 | Discharge: 2024-11-03 | Disposition: A | Source: Ambulatory Visit | Attending: Family Medicine | Admitting: Family Medicine

## 2024-11-03 DIAGNOSIS — Z1231 Encounter for screening mammogram for malignant neoplasm of breast: Secondary | ICD-10-CM

## 2024-11-08 ENCOUNTER — Ambulatory Visit

## 2024-12-15 ENCOUNTER — Ambulatory Visit: Admitting: Family Medicine
